# Patient Record
Sex: Female | Born: 1980 | Race: White | Hispanic: No | Marital: Married | Smoking: Current every day smoker
Health system: Southern US, Community
[De-identification: ages and names within clinical notes are randomized; demographics above are authoritative.]

## PROBLEM LIST (undated history)

## (undated) DIAGNOSIS — I214 Non-ST elevation (NSTEMI) myocardial infarction: Secondary | ICD-10-CM

## (undated) DIAGNOSIS — F1911 Other psychoactive substance abuse, in remission: Secondary | ICD-10-CM

## (undated) DIAGNOSIS — E669 Obesity, unspecified: Secondary | ICD-10-CM

## (undated) DIAGNOSIS — Z720 Tobacco use: Secondary | ICD-10-CM

## (undated) DIAGNOSIS — Z951 Presence of aortocoronary bypass graft: Secondary | ICD-10-CM

## (undated) DIAGNOSIS — F329 Major depressive disorder, single episode, unspecified: Secondary | ICD-10-CM

## (undated) DIAGNOSIS — I25110 Atherosclerotic heart disease of native coronary artery with unstable angina pectoris: Secondary | ICD-10-CM

## (undated) DIAGNOSIS — F32A Depression, unspecified: Secondary | ICD-10-CM

## (undated) DIAGNOSIS — F419 Anxiety disorder, unspecified: Secondary | ICD-10-CM

## (undated) HISTORY — PX: INGUINAL HERNIA REPAIR: SUR1180

---
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
## 2000-01-20 HISTORY — PX: ADENOIDECTOMY: SUR15

## 2008-01-20 HISTORY — PX: TUBAL LIGATION: SHX77

## 2010-06-16 ENCOUNTER — Emergency Department (HOSPITAL_COMMUNITY)
Admission: EM | Admit: 2010-06-16 | Discharge: 2010-06-16 | Disposition: A | Discharge status: Home / Self Care | Payer: Self-pay | Attending: Emergency Medicine | Admitting: Emergency Medicine

## 2010-06-16 DIAGNOSIS — R109 Unspecified abdominal pain: Secondary | ICD-10-CM | POA: Insufficient documentation

## 2010-06-16 DIAGNOSIS — R319 Hematuria, unspecified: Secondary | ICD-10-CM | POA: Insufficient documentation

## 2010-06-16 DIAGNOSIS — K089 Disorder of teeth and supporting structures, unspecified: Secondary | ICD-10-CM | POA: Insufficient documentation

## 2010-06-16 DIAGNOSIS — R300 Dysuria: Secondary | ICD-10-CM | POA: Insufficient documentation

## 2010-06-16 DIAGNOSIS — N390 Urinary tract infection, site not specified: Secondary | ICD-10-CM | POA: Insufficient documentation

## 2010-06-16 LAB — URINE MICROSCOPIC-ADD ON

## 2010-06-16 LAB — URINALYSIS, ROUTINE W REFLEX MICROSCOPIC
Glucose, UA: NEGATIVE mg/dL
Nitrite: NEGATIVE
Specific Gravity, Urine: 1.015 (ref 1.005–1.030)
pH: 7 (ref 5.0–8.0)

## 2010-06-16 LAB — POCT PREGNANCY, URINE: Preg Test, Ur: NEGATIVE

## 2012-09-26 ENCOUNTER — Encounter (HOSPITAL_COMMUNITY): Payer: Self-pay | Admitting: *Deleted

## 2012-09-26 ENCOUNTER — Emergency Department (HOSPITAL_COMMUNITY)
Admission: EM | Admit: 2012-09-26 | Discharge: 2012-09-26 | Disposition: A | Discharge status: Home / Self Care | Payer: Medicaid Other | Attending: Emergency Medicine | Admitting: Emergency Medicine

## 2012-09-26 DIAGNOSIS — Y93G3 Activity, cooking and baking: Secondary | ICD-10-CM | POA: Insufficient documentation

## 2012-09-26 DIAGNOSIS — Y929 Unspecified place or not applicable: Secondary | ICD-10-CM | POA: Insufficient documentation

## 2012-09-26 DIAGNOSIS — W268XXA Contact with other sharp object(s), not elsewhere classified, initial encounter: Secondary | ICD-10-CM | POA: Insufficient documentation

## 2012-09-26 DIAGNOSIS — S61219A Laceration without foreign body of unspecified finger without damage to nail, initial encounter: Secondary | ICD-10-CM

## 2012-09-26 DIAGNOSIS — S61209A Unspecified open wound of unspecified finger without damage to nail, initial encounter: Secondary | ICD-10-CM | POA: Insufficient documentation

## 2012-09-26 DIAGNOSIS — F17200 Nicotine dependence, unspecified, uncomplicated: Secondary | ICD-10-CM | POA: Insufficient documentation

## 2012-09-26 MED ORDER — LIDOCAINE HCL (PF) 2 % IJ SOLN
INTRAMUSCULAR | Status: AC
  Administered 2012-09-26: 10 mL
  Filled 2012-09-26: qty 10

## 2012-09-26 MED ORDER — CEPHALEXIN 500 MG PO CAPS: 500.0000 mg | ORAL_CAPSULE | Freq: Four times a day (QID) | ORAL | Status: DC

## 2012-09-26 MED ORDER — TRAMADOL HCL 50 MG PO TABS: 50.0000 mg | ORAL_TABLET | Freq: Four times a day (QID) | ORAL | Status: DC | PRN

## 2012-09-26 NOTE — ED Provider Notes (Signed)
CSN: [id number]     Arrival date & time 09/26/12  1531 History   First MD Initiated Contact with Patient 09/26/12 1538     Chief Complaint  Patient presents with  . Extremity Laceration   (Consider location/radiation/quality/duration/timing/severity/associated sxs/prior Treatment) HPI Comments: Catherine Goodman is a 32 y.o. Female presenting with a laceration to her right long finger,  Which occurred 12 hours ago.  She reports cutting it on a broken ceramic bowl while preparing breakfast around 4 am today.  She cleaned the wound and applied a bandaid,  But it continues to cause pain.  She denies numbness or weakness distal to the injury site.  She is utd with her tetanus.     The history is provided by the patient.    History reviewed. No pertinent past medical history. Past Surgical History  Procedure Laterality Date  . Hernia repair    . Cesarean section    . Tubal ligation     No family history on file. History  Substance Use Topics  . Smoking status: Current Every Day Smoker    Types: Cigarettes  . Smokeless tobacco: Not on file  . Alcohol Use: Yes     Comment: occasional   OB History   Grav Para Term Preterm Abortions TAB SAB Ect Mult Living                 Review of Systems  Constitutional: Negative for fever and chills.  HENT: Negative for facial swelling.   Respiratory: Negative for shortness of breath and wheezing.   Skin: Positive for wound.  Neurological: Negative for numbness.    Allergies  Review of patient's allergies indicates no known allergies.  Home Medications   Current Outpatient Rx  Name  Route  Sig  Dispense  Refill  . traMADol (ULTRAM) 50 MG tablet   Oral   Take 1 tablet (50 mg total) by mouth every 6 (six) hours as needed for pain.   15 tablet   0    BP 137/99  Pulse 95  Temp(Src) 98.4 F (36.9 C) (Oral)  Resp 20  Ht 5\' 6"  (1.676 m)  Wt 140 lb (63.504 kg)  BMI 22.61 kg/m2  SpO2 100%  LMP 09/19/2012 Physical Exam   Constitutional: She is oriented to person, place, and time. She appears well-developed and well-nourished.  HENT:  Head: Normocephalic.  Cardiovascular: Normal rate.   Pulmonary/Chest: Effort normal.  Neurological: She is alert and oriented to person, place, and time. No sensory deficit.  Skin: Laceration noted.  2 cm subcutaneous laceration volar finger, middle phalanx,  Clean well visualize base without retained fb.  Hemostatic,  Distal sensation intact.  Less than 3 sec cap refill.    ED Course  Procedures (including critical care time)  LACERATION REPAIR Performed by: Burgess Amor Authorized by: Burgess Amor Consent: Verbal consent obtained. Risks and benefits: risks, benefits and alternatives were discussed Consent given by: patient Patient identity confirmed: provided demographic data Prepped and Draped in normal sterile fashion Wound explored  Laceration Location: right index finger  Laceration Length: 2cm  No Foreign Bodies seen or palpated  Anesthesia:digital block  Local anesthetic: lidocaine 1% without epinephrine  Anesthetic total: 2 ml  Irrigation method: syringe Amount of cleaning: standard  Skin closure: ethilon 4-0  Number of sutures: 4  Technique: simple interrupted.  Patient tolerance: Patient tolerated the procedure well with no immediate complications.  Labs Review Labs Reviewed - No data to display Imaging Review No results found.  MDM   1. Laceration of finger of right hand, initial encounter    Wound care instructions given.  Pt advised to have sutures removed in 10 days,  Return here sooner for any signs of infection including redness, swelling, worse pain or drainage of pus.  Pt was given tramadol for pain relief.  Also prescribed keflex as this was an old laceration,  68 hours old.  Wound edges were slightly macerated (from being in a wet bandaid).  Explained to pt that the top layer may peel,  But will protect while the deeper layer is  healing.  Pt understands.       Burgess Amor, PA-C 09/26/12 1656

## 2012-09-26 NOTE — ED Provider Notes (Signed)
Medical screening examination/treatment/procedure(s) were performed by non-physician practitioner and as supervising physician I was immediately available for consultation/collaboration.   Edynn Gillock, MD 09/26/12 1842 

## 2012-09-26 NOTE — ED Notes (Signed)
Person identifying  himself as pts  Husband called and asked that we not give any  Pain pills to pt . Says she would "sniff them".

## 2012-09-26 NOTE — ED Notes (Signed)
Lac to RMF 5am ,  On broken ceramic bowl. Lac is to palmar surface of  Middle phalanx

## 2012-09-26 NOTE — ED Notes (Signed)
Laceration to right middle finger from a broken ceramic bowl.  Bleeding controlled.

## 2013-10-05 ENCOUNTER — Encounter (HOSPITAL_COMMUNITY): Payer: Self-pay | Admitting: Emergency Medicine

## 2013-10-05 ENCOUNTER — Emergency Department (HOSPITAL_COMMUNITY)
Admission: EM | Admit: 2013-10-05 | Discharge: 2013-10-05 | Discharge status: Against Medical Advice | Payer: Medicaid Other | Attending: Emergency Medicine | Admitting: Emergency Medicine

## 2013-10-05 DIAGNOSIS — F17200 Nicotine dependence, unspecified, uncomplicated: Secondary | ICD-10-CM | POA: Insufficient documentation

## 2013-10-05 DIAGNOSIS — R05 Cough: Secondary | ICD-10-CM | POA: Insufficient documentation

## 2013-10-05 DIAGNOSIS — R093 Abnormal sputum: Secondary | ICD-10-CM | POA: Insufficient documentation

## 2013-10-05 DIAGNOSIS — R51 Headache: Secondary | ICD-10-CM | POA: Insufficient documentation

## 2013-10-05 DIAGNOSIS — R52 Pain, unspecified: Secondary | ICD-10-CM | POA: Diagnosis not present

## 2013-10-05 DIAGNOSIS — R059 Cough, unspecified: Secondary | ICD-10-CM | POA: Insufficient documentation

## 2013-10-05 DIAGNOSIS — J3489 Other specified disorders of nose and nasal sinuses: Secondary | ICD-10-CM | POA: Insufficient documentation

## 2013-10-05 DIAGNOSIS — M549 Dorsalgia, unspecified: Secondary | ICD-10-CM | POA: Insufficient documentation

## 2013-10-05 NOTE — ED Notes (Signed)
Patient and visitor to nursing station. Visitor upset because they have been waiting and in his opinion they have been "waiting too long", "havent been seen fast enough" visitor stated they were "going somewhere else" where they "can been seen faster". Explained to patient and visitor that we would seen them as soon as possible, that they were next to be seen. Explained that the pt would be leaving against medical advice and what that meant. Catherine Goodman at nursing station when pt left AMA. Pt refused to sign E-signature.

## 2013-10-05 NOTE — ED Notes (Signed)
Cough,body aches,  Back pain,  Yellow sputum, nasal congestion, headache,.  Husband dx with pneumonia yesterday

## 2014-01-20 ENCOUNTER — Encounter (HOSPITAL_COMMUNITY): Payer: Self-pay

## 2014-01-20 ENCOUNTER — Emergency Department (HOSPITAL_COMMUNITY)
Admission: EM | Admit: 2014-01-20 | Discharge: 2014-01-20 | Disposition: A | Discharge status: Home / Self Care | Payer: Medicaid Other | Attending: Emergency Medicine | Admitting: Emergency Medicine

## 2014-01-20 DIAGNOSIS — K029 Dental caries, unspecified: Secondary | ICD-10-CM | POA: Diagnosis not present

## 2014-01-20 DIAGNOSIS — K088 Other specified disorders of teeth and supporting structures: Secondary | ICD-10-CM | POA: Insufficient documentation

## 2014-01-20 DIAGNOSIS — Z720 Tobacco use: Secondary | ICD-10-CM | POA: Insufficient documentation

## 2014-01-20 DIAGNOSIS — Z79899 Other long term (current) drug therapy: Secondary | ICD-10-CM | POA: Insufficient documentation

## 2014-01-20 DIAGNOSIS — K0889 Other specified disorders of teeth and supporting structures: Secondary | ICD-10-CM

## 2014-01-20 MED ORDER — AMOXICILLIN 500 MG PO CAPS: 500.0000 mg | ORAL_CAPSULE | Freq: Three times a day (TID) | ORAL | Status: DC

## 2014-01-20 MED ORDER — HYDROCODONE-ACETAMINOPHEN 5-325 MG PO TABS: ORAL_TABLET | ORAL | Status: DC

## 2014-01-20 NOTE — ED Notes (Signed)
Complain of dental pain and swelling to left side of face

## 2014-01-20 NOTE — ED Provider Notes (Signed)
CSN: [id number]     Arrival date & time 01/20/14  1547 History   First MD Initiated Contact with Patient 01/20/14 1611     Chief Complaint  Patient presents with  . Dental Pain     (Consider location/radiation/quality/duration/timing/severity/associated sxs/prior Treatment) HPI  Catherine Goodman is a 34 y.o. female who presents to the Emergency Department complaining of dental pain for 3 days.  She reports sharp, throbbing pain to her left upper teeth.  Pain radiates into her ear and cheek.  She also reports having swelling of her gums and intermittent chills.  She has been taking ibuprofen for pain without relief.  She has not contacted a dentist, states that her Medicaid will have to be reinstated.  She denies difficulty swallowing, breathing, fever, or neck pain    History reviewed. No pertinent past medical history. Past Surgical History  Procedure Laterality Date  . Hernia repair    . Cesarean section    . Tubal ligation     No family history on file. History  Substance Use Topics  . Smoking status: Current Every Day Smoker    Types: Cigarettes  . Smokeless tobacco: Not on file  . Alcohol Use: Yes     Comment: occasional   OB History    No data available     Review of Systems  Constitutional: Negative for fever and appetite change.  HENT: Positive for dental problem and facial swelling. Negative for congestion, sore throat and trouble swallowing.   Eyes: Negative for pain and visual disturbance.  Musculoskeletal: Negative for neck pain and neck stiffness.  Skin: Negative for rash.  Neurological: Negative for dizziness, facial asymmetry and headaches.  Hematological: Negative for adenopathy.  All other systems reviewed and are negative.     Allergies  Review of patient's allergies indicates no known allergies.  Home Medications   Prior to Admission medications   Medication Sig Start Date End Date Taking? Authorizing Provider  acetaminophen (TYLENOL) 500 MG  tablet Take 1,000 mg by mouth every 6 (six) hours as needed for headache.    Historical Provider, MD  ibuprofen (ADVIL,MOTRIN) 200 MG tablet Take 400 mg by mouth every 6 (six) hours as needed for headache.    Historical Provider, MD  Phenyleph-Doxylamine-DM-APAP (ALKA-SELTZER PLS NIGHT CLD/FLU) 5-6.25-10-325 MG CAPS Take 2 capsules by mouth daily as needed (cold).    Historical Provider, MD   BP 136/87 mmHg  Pulse 85  Temp(Src) 98 F (36.7 C) (Oral)  Resp 18  Ht  (1.676 m)  Wt 160 lb (72.576 kg)  BMI 25.84 kg/m2  SpO2 100%  LMP 01/13/2014 Physical Exam  Constitutional: She is oriented to person, place, and time. She appears well-developed and well-nourished. No distress.  HENT:  Head: Normocephalic and atraumatic.  Right Ear: Tympanic membrane and ear canal normal.  Left Ear: Tympanic membrane and ear canal normal.  Mouth/Throat: Uvula is midline, oropharynx is clear and moist and mucous membranes are normal. No trismus in the jaw. Dental caries present. No dental abscesses or uvula swelling.  Tenderness to palpation of the left upper molars, no obvious dental decay. Mild erythema and edema of the surrounding gums.  No facial swelling, obvious dental abscess, trismus, or sublingual abnml.    Neck: Normal range of motion. Neck supple.  Cardiovascular: Normal rate, regular rhythm and normal heart sounds.   No murmur heard. Pulmonary/Chest: Effort normal and breath sounds normal. No respiratory distress.  Musculoskeletal: Normal range of motion.  Lymphadenopathy:  She has no cervical adenopathy.  Neurological: She is alert and oriented to person, place, and time. She exhibits normal muscle tone. Coordination normal.  Skin: Skin is warm and dry.  Nursing note and vitals reviewed.   ED Course  Procedures (including critical care time) Labs Review Labs Reviewed - No data to display  Imaging Review No results found.   EKG Interpretation None      MDM   Final  diagnoses:  Pain, dental    Pt is non-toxic appearing,  Tearful on exam.  Reviewed on the [place] narcotic database.  No recent Rx's on file.    No concerning sx's for infection to the deep structures of the neck or floor of the mouth.  Pt given Rx for amoxil and hydrocodone.  Referral info given for local dentistry.  She appears stable for d/c   Zayra Devito L. Trisha Mangle, PA-C 01/21/14 2333  Flint Melter, MD 01/24/14 [phone]

## 2014-01-20 NOTE — Discharge Instructions (Signed)
Dental Pain °A tooth ache may be caused by cavities (tooth decay). Cavities expose the nerve of the tooth to air and hot or cold temperatures. It may come from an infection or abscess (also called a boil or furuncle) around your tooth. It is also often caused by dental caries (tooth decay). This causes the pain you are having. °DIAGNOSIS  °Your caregiver can diagnose this problem by exam. °TREATMENT  °· If caused by an infection, it may be treated with medications which kill germs (antibiotics) and pain medications as prescribed by your caregiver. Take medications as directed. °· Only take over-the-counter or prescription medicines for pain, discomfort, or fever as directed by your caregiver. °· Whether the tooth ache today is caused by infection or dental disease, you should see your dentist as soon as possible for further care. °SEEK MEDICAL CARE IF: °The exam and treatment you received today has been provided on an emergency basis only. This is not a substitute for complete medical or dental care. If your problem worsens or new problems (symptoms) appear, and you are unable to meet with your dentist, call or return to this location. °SEEK IMMEDIATE MEDICAL CARE IF:  °· You have a fever. °· You develop redness and swelling of your face, jaw, or neck. °· You are unable to open your mouth. °· You have severe pain uncontrolled by pain medicine. °MAKE SURE YOU:  °· Understand these instructions. °· Will watch your condition. °· Will get help right away if you are not doing well or get worse. °Document Released: 01/05/2005 Document Revised: 03/30/2011 Document Reviewed: 08/24/2007 °ExitCare® Patient Information ©2015 [employer]. This information is not intended to replace advice given to you by your health care provider. Make sure you discuss any questions you have with your health care provider. ° °Emergency Department Resource Guide °1) Find a Doctor and Pay Out of Pocket °Although you won't have to find out who  is covered by your insurance plan, it is a good idea to ask around and get recommendations. You will then need to call the office and see if the doctor you have chosen will accept you as a new patient and what types of options they offer for patients who are self-pay. Some doctors offer discounts or will set up payment plans for their patients who do not have insurance, but you will need to ask so you aren't surprised when you get to your appointment. ° °2) Contact Your Local Health Department °Not all health departments have doctors that can see patients for sick visits, but many do, so it is worth a call to see if yours does. If you don't know where your local health department is, you can check in your phone book. The CDC also has a tool to help you locate your state's health department, and many state websites also have listings of all of their local health departments. ° °3) Find a Walk-in Clinic °If your illness is not likely to be very severe or complicated, you may want to try a walk in clinic. These are popping up all over the country in pharmacies, drugstores, and shopping centers. They're usually staffed by nurse practitioners or physician assistants that have been trained to treat common illnesses and complaints. They're usually fairly quick and inexpensive. However, if you have serious medical issues or chronic medical problems, these are probably not your best option. ° °No Primary Care Doctor: °- Call Health Connect at  832-8000 - they can help you locate a primary   care doctor that  accepts your insurance, provides certain services, etc. °- Physician Referral Service- 1-800-533-3463 ° °Chronic Pain Problems: °Organization         Address  Phone   Notes  °[facility]  (336) 297-2271 Patients need to be referred by their primary care doctor.  ° °Medication Assistance: °Organization         Address  Phone   Notes  °[place] [facility] [address]  [address] °[place], [place] [address] (336) 641-8030 --Must be a resident of [place] °-- Must have NO insurance coverage whatsoever (no Medicaid/ Medicare, etc.) °-- The pt. MUST have a primary care doctor that directs their care regularly and follows them in the community °  °[facility]  (866) 331-1348   °[employer]  (888) 892-1162   ° °Agencies that provide inexpensive medical care: °Organization         Address  Phone   Notes  °[facility]  (336) 832-8035   °[facility]    (336) 832-7272   °[facility] [address] °[place], [place] [address] (336) 832-4777   °[facility] [address], °[place] (336) 271-4999   °[facility]    (336) 373-0678   °[facility]    (336) 272-1050   °[facility] ° [address], [place] Phone:  (336) 832-4444, Fax:  (336) 832-4440 Hours of Operation:  9 am - 6 pm, M-F.  Also accepts Medicaid/Medicare and self-pay.  °[facility] ° [address], [place] Phone: (336) 832-3150, Fax: (336) 832-3151. Hours of Operation:  8:30 am - 5:30 pm, M-F.  Also accepts Medicaid and self-pay.  °[facility] [place] [address], [place] Phone: (336) 878-6027   °[facility] [address], [place], [place] (336)723-1848, Ext. 123 Mondays & Thursdays: 7-9 AM.  First 15 patients are seen on a first come, first serve basis. °  ° °Medicaid-accepting [place] Providers: ° °Organization         Address  Phone   Notes  °[facility] [address], [place] (336) 641-2100 Also accepts self-pay patients.  °[facility] [address], [place] ° (336) 856-9996   °[facility] [address], [place] (336) 288-8857   °[facility] [address] [place] [address], [place] (336) 299-7000   °Veita Bland [address], [place]  ° (336) 373-1557 Only accepts [place] Access Medicaid patients after they have their name applied to their card.  ° °Self-Pay (no insurance) in [place]: ° °Organization         Address  Phone   Notes  °Sickle Cell Patients, [facility] [address], [place] (336) 832-1970   °[facility] [address], [place] (336) 832-4400   °[facility] [place] [facility] ° [address], [place] (336) 992-4800   °[facility]/Dr. Osei-Bonsu ° [address] [place] [address], [place] or [address], [place] (336) 841-8500 Phone number for both [place] and [place] locations is the same.  °[facility] [address], [place] (336) 299-0000   °[facility] [address] [place] [address], [place] or [address] (336) 852-7530 °(336) 878-2260   °  [facility] [address], [place] (336) 350-1642, phone; (336) 294-5005, fax Sees patients 1st and 3rd Saturday of every month.  Must not qualify for public or private insurance (i.e. Medicaid, Medicare, [place] Health Choice, Veterans' Benefits) • Household income should be no more than 200% of the poverty level •The clinic cannot treat you if you are pregnant or think you are pregnant • Sexually transmitted diseases are not treated at the clinic.  ° ° °Dental Care: °Organization         Address  Phone  Notes  °[facility] [facility] [address], [place] (336) 641-6152 Accepts children up to age 21 who are enrolled in Medicaid or [place] Health Choice; pregnant women with a Medicaid card; and children who have applied for Medicaid or [place] Health Choice, but were declined, whose parents can pay a reduced fee at time of service.  °[facility] [place]  [address], [place] (336) 641-7733 Accepts children up to age 21 who are  enrolled in Medicaid or [place] Health Choice; pregnant women with a Medicaid card; and children who have applied for Medicaid or [place] Health Choice, but were declined, whose parents can pay a reduced fee at time of service.  °[facility] ° [address], [place] (336) 641-4533 Patients are seen by appointment only. Walk-ins are not accepted. [facility] will see patients 18 years of age and older. °Monday - Tuesday (8am-5pm) °Most Wednesdays (8:30-5pm) °$30 per visit, cash only  °[facility] ° [address], [place] (336) 641-4533 Patients are seen by appointment only. Walk-ins are not accepted. [facility] will see patients 18 years of age and older. °One Wednesday Evening (Monthly: Volunteer Based).  $30 per visit, cash only  °[facility]  (919) 537-3737 for adults; Children under age 4, call Graduate Pediatric Dentistry at ([phone]) [phone]. Children aged 4-14, please call (919) [phone] to request a pediatric application. ° Dental services are provided in all areas of dental care including fillings, crowns and bridges, complete and partial dentures, implants, gum treatment, root canals, and extractions. Preventive care is also provided. Treatment is provided to both adults and children. °Patients are selected via a lottery and there is often a waiting list. °  °[facility] [address], °[place] ° (336) 763-8833 [email or web address] °  °[facility] [address], [place], [place] (336)723-1848, Ext. 123 Second and Fourth Thursday of each month, opens at 6:30 AM; Clinic ends at 9 AM.  Patients are seen on a first-come first-served basis, and a limited number are seen during each clinic.  ° °[facility] ° [address], [place], [place] (336) 723-7904   Eligibility Requirements °You must have lived in [place], or [place] counties for at least the last three months. °  You  cannot be eligible for state or federal sponsored healthcare insurance, including Veterans Administration, Medicaid, or Medicare. °  You generally cannot be eligible for healthcare insurance through your employer.  °  How to apply: °Eligibility screenings are held every Tuesday and Wednesday afternoon from 1:00 pm until 4:00 pm. You do not need an appointment for the interview!  °[facility] [address], [place], [place] 336-631-2330   °[facility] [facility]  336-342-8273   °[facility]  336-703-3100   °[facility]  336-570-6415   ° °Behavioral Health Resources in the Community: °Intensive Outpatient Programs °Organization         Address  Phone  Notes  °[facility] [address], [place], [place] 336-878-6098   °[facility] [address], [place], [place] 336-832-9800   °[facility] [address], [place], [place] ° 336-882-2125   °[facility] [address],  °[place], [place] 1-800-853-5163 or 336-641-4981   °Substance Abuse Resources °Organization         Address  Phone  Notes  °[facility]  336-882-2125   °[facility]  336-784-9470   °[facility]  336-285-9073   °[facility]  336-845-3988   °Residential & Outpatient Substance Abuse Program  1-800-659-3381   °Psychological Services °Organization         Address  Phone  Notes  °[facility]  336- 832-9600   °[facility]  336- 378-7881   °[facility] [address], [place] 1-800-853-5163 or 336-641-4981   ° °Mobile Crisis Teams °Organization         Address  Phone  Notes  °[facility], Mobile Crisis Care Unit  1-877-626-1772   °Assertive °[facility] ° [address] [place], [place] 336-834-9664   °Sharon DeEsch [address] °[place] 336-554-5454   ° °Self-Help/Support  Groups °Organization         Address  Phone             Notes  °[facility] - variety of support groups  336- 373-1402 Call for more information  °Narcotics Anonymous (NA), [facility] [address], °[place]  2 meetings at this location  ° °Residential Treatment Programs °Organization         Address  Phone  Notes  °[facility] [address],    °[place]  1-866-801-8205   °[facility] ° [address], [place], [place] 704-293-8524   °[facility] [address], [place] 336-845-3988 Admissions: 8am-3pm M-F  °[facility] [address]    °[place], [place] 336-841-1104   °[facility] [address], [place], [place] 336-379-7146   °[facility] [address]  °[place], [place] 336-285-9073   °[facility] - Intensive Outpatient [address], [place], [place] 336-852-3033   °[facility] [address]  °[place], [place] 1-877-615-2722 or 336-784-9470   °[facility] ([facility] [address] [place], [place] 336-227-7417 Accepts Medicaid  °[facility] [address]  °[place] 1-800-659-3381 Substance Abuse/Addiction Treatment  ° °[place] Behavioral Health Resources °Organization         Address  Phone  Notes  °[facility]  (888) 581-9988   °Julie Brannon, PhD [address] [place], [place]   (336) 349-5553 or (336) 951-0000   °[facility]   [address] °[place], [place] (336) 349-4454   °[facility] [address], [place], [place] (336) 342-8316 Insurance/Medicaid/sponsorship through [facility]  °[facility] [address]                                    [place], [place] (336) 342-8316 Therapy/tele-psych/case  °[facility]   [address]  ° [place], [place] (336) 349-2233    °Dr. Arfeen  (336) 349-4544   °[facility] [place]  [employer] [facility] [place] [facility] 1) [address], [place] °2) [address], [place] °3)  [address], [place] (336) 349-3220 °(336) 342-7768 ° °(336) 342-8140   °[place] Child Abuse Hotline (336) 342-1394 or (336) 342-3537 (After Hours)    ° ° ° °

## 2014-02-19 ENCOUNTER — Encounter (HOSPITAL_COMMUNITY): Payer: Self-pay

## 2014-02-19 ENCOUNTER — Emergency Department (HOSPITAL_COMMUNITY)
Admission: EM | Admit: 2014-02-19 | Discharge: 2014-02-19 | Disposition: A | Discharge status: Home / Self Care | Payer: Medicaid Other | Attending: Emergency Medicine | Admitting: Emergency Medicine

## 2014-02-19 DIAGNOSIS — K088 Other specified disorders of teeth and supporting structures: Secondary | ICD-10-CM | POA: Insufficient documentation

## 2014-02-19 DIAGNOSIS — K0889 Other specified disorders of teeth and supporting structures: Secondary | ICD-10-CM

## 2014-02-19 DIAGNOSIS — Z720 Tobacco use: Secondary | ICD-10-CM | POA: Diagnosis not present

## 2014-02-19 MED ORDER — IBUPROFEN 600 MG PO TABS: 600.0000 mg | ORAL_TABLET | Freq: Four times a day (QID) | ORAL | Status: DC | PRN

## 2014-02-19 MED ORDER — IBUPROFEN 800 MG PO TABS: 800.0000 mg | ORAL_TABLET | Freq: Once | ORAL | Status: DC

## 2014-02-19 NOTE — ED Provider Notes (Signed)
CSN: [id number]     Arrival date & time 02/19/14  1736 History  This chart was scribed for Catherine Goodman Catherine Petion, MD by Abel PrestoKara Demonbreun, ED Scribe. This patient was seen in room APA11/APA11 and the patient's care was started at 5:54 PM.    Chief Complaint  Patient presents with  . Dental Pain     Patient is a 34 y.o. female presenting with tooth pain. The history is provided by the patient. No language interpreter was used.  Dental Pain Location:  Upper Severity:  Severe Duration:  3 days Timing:  Constant Progression:  Unchanged Chronicity:  Recurrent Associated symptoms: no facial swelling and no fever   HPI Comments: Catherine Goodman is a 34 y.o. female who presents to the Emergency Department complaining of upper left sided dental pain with onset 3 days ago.  Pt notes associated chills. Pt was seen on 01/02 for same. Pt has finished course of amoxicillin. Pt states she was unable to follow-up with a dentist due to problems with her medicaid. Pt denies fever.  PMH - none  Past Surgical History  Procedure Laterality Date  . Hernia repair    . Cesarean section    . Tubal ligation     No family history on file. History  Substance Use Topics  . Smoking status: Current Every Day Smoker    Types: Cigarettes  . Smokeless tobacco: Not on file  . Alcohol Use: Yes     Comment: occasional   OB History    No data available     Review of Systems  Constitutional: Negative for fever.  HENT: Positive for dental problem. Negative for facial swelling.   Gastrointestinal: Negative for vomiting.      Allergies  Review of patient's allergies indicates no known allergies.  Home Medications   Prior to Admission medications   Medication Sig Start Date End Date Taking? Authorizing Provider  ibuprofen (ADVIL,MOTRIN) 600 MG tablet Take 1 tablet (600 mg total) by mouth every 6 (six) hours as needed. 02/19/14   Catherine Goodman [name], MD   BP 131/89 mmHg  Pulse 86  Temp(Src) 98.5 F (36.9 C)  (Oral)  Resp 18  Ht 5\' 6"  (1.676 m)  Wt 160 lb (72.576 kg)  BMI 25.84 kg/m2  SpO2 100%  LMP 02/13/2014 Physical Exam  Nursing note and vitals reviewed.  CONSTITUTIONAL: Well developed/well nourished HEAD AND FACE: Normocephalic/atraumatic EYES: EOMI/PERRL ENMT: Mucous membranes moist.  Poor dentition.  No trismus.  No focal abscess noted. NECK: supple no meningeal signs CV: S1/S2 noted, no murmurs/rubs/gallops noted LUNGS: Lungs are clear to auscultation bilaterally, no apparent distress ABDOMEN: soft, nontender, no rebound or guarding NEURO: Pt is awake/alert, moves all extremitiesx4 EXTREMITIES:full ROM SKIN: warm, color normal    ED Course  Procedures  DIAGNOSTIC STUDIES: Oxygen Saturation is 100% on room air, normal by my interpretation.    COORDINATION OF CARE: 5:56 PM Discussed treatment plan with patient at beside, the patient agrees with the plan and has no further questions at this time.    MDM   Final diagnoses:  Pain, dental    Nursing notes including past medical history and social history reviewed and considered in documentation   I personally performed the services described in this documentation, which was scribed in my presence. The recorded information has been reviewed and is accurate.      Catherine Goodman Solveig Fangman, MD 02/19/14 [phone]

## 2014-02-19 NOTE — ED Notes (Signed)
Pt not in room for discharge, believe pt. Walked out.

## 2014-02-19 NOTE — Discharge Instructions (Signed)

## 2014-02-19 NOTE — ED Notes (Signed)
Pt reports dental abscess that was treated with amoxicillin on Dec 30.  Reports abscess went away but came back 3 days ago.  Says followed up at the dental clinic but says was told her medicaid was "no good."

## 2015-07-16 DIAGNOSIS — Z139 Encounter for screening, unspecified: Secondary | ICD-10-CM

## 2015-09-17 NOTE — Congregational Nurse Program (Signed)
Congregational Nurse Program Note  Date of Encounter: 07/16/2015  Past Medical History: No past medical history on file.  Encounter Details: Client was assisted with appointment to establish a primary provider at the [facility]. Client has several health issues  that need to be addressed. Informed client of dental clinic scheduled for July at the [facility] to address dental issues. Pearletha AlfredJan Nehal Shives,RN CN  [phone].

## 2015-12-15 ENCOUNTER — Emergency Department (HOSPITAL_COMMUNITY)
Admission: EM | Admit: 2015-12-15 | Discharge: 2015-12-15 | Disposition: A | Discharge status: Home / Self Care | Payer: Medicaid Other | Attending: Emergency Medicine | Admitting: Emergency Medicine

## 2015-12-15 ENCOUNTER — Encounter (HOSPITAL_COMMUNITY): Payer: Self-pay | Admitting: *Deleted

## 2015-12-15 DIAGNOSIS — K047 Periapical abscess without sinus: Secondary | ICD-10-CM | POA: Insufficient documentation

## 2015-12-15 DIAGNOSIS — Z791 Long term (current) use of non-steroidal anti-inflammatories (NSAID): Secondary | ICD-10-CM | POA: Insufficient documentation

## 2015-12-15 DIAGNOSIS — F17210 Nicotine dependence, cigarettes, uncomplicated: Secondary | ICD-10-CM | POA: Insufficient documentation

## 2015-12-15 MED ORDER — AMOXICILLIN 500 MG PO CAPS: 500.0000 mg | ORAL_CAPSULE | Freq: Three times a day (TID) | ORAL | 0 refills | Status: AC

## 2015-12-15 MED ORDER — TRAMADOL HCL 50 MG PO TABS
50.0000 mg | ORAL_TABLET | Freq: Once | ORAL | Status: AC
  Administered 2015-12-15: 50 mg via ORAL
  Filled 2015-12-15: qty 1

## 2015-12-15 MED ORDER — TRAMADOL HCL 50 MG PO TABS: 50.0000 mg | ORAL_TABLET | Freq: Four times a day (QID) | ORAL | 0 refills | Status: DC | PRN

## 2015-12-15 NOTE — ED Triage Notes (Signed)
Pt right lower dental pain for 2 weeks, for last week c/o right jaw pain.  Taking ibuprofen for pain

## 2015-12-15 NOTE — Discharge Instructions (Signed)
Complete your entire course of antibiotics as prescribed.  You  may use the tramadol for pain relief but do not drive within 4 hours of taking as this will make you drowsy.  Avoid applying heat or ice to this abscess area which can worsen your symptoms.  You may use warm salt water swish and spit treatment or half peroxide and water swish and spit after meals to keep this area clean as discussed.  Call the dentist listed above for further management of your symptoms. ° ° °

## 2015-12-15 NOTE — ED Provider Notes (Signed)
[facility]-EMERGENCY DEPT Provider Note   CSN: [id number] Arrival date & time: 12/15/15  1455   By signing my name below, I, Valentino SaxonBianca Contreras, attest that this documentation has been prepared under the direction and in the presence of Burgess AmorJulie Sharni Negron, PA-C Electronically Signed: Valentino SaxonBianca Contreras, ED Scribe. 12/15/15. 4:52 PM.  History   Chief Complaint Chief Complaint  Patient presents with  . Dental Pain   The history is provided by the patient. No language interpreter was used.   HPI Comments: Catherine Goodman is a [age] y.o. female who presents to the Emergency Department complaining of gradually worsening right-sided dental pain onset a week ago. Pt notes that her pain has increased over the past three days to the point where she can't sleep. She notes having a bad tooth in the back that is broken with accompanied jaw pain. She notes trying salt water remedy, mouthwash, orajel and baking soda remedy. Pt reports taking  ibuprofen without relief. She also notes breaking out in sweats when her dental pain is brought on. Pt denies fever and facial swelling. Per pt, she notes taking prescribed Buspar. No additional complaints at this time.   No past medical history on file.  There are no active problems to display for this patient.   Past Surgical History:  Procedure Laterality Date  . CESAREAN SECTION    . HERNIA REPAIR    . TUBAL LIGATION      OB History    No data available       Home Medications    Prior to Admission medications   Medication Sig Start Date End Date Taking? Authorizing Provider  ibuprofen (ADVIL,MOTRIN) 600 MG tablet Take 1 tablet (600 mg total) by mouth every 6 (six) hours as needed. 02/19/14   Zadie Rhineonald Wickline, MD    Family History No family history on file.  Social History Social History  Substance Use Topics  . Smoking status: Current Every Day Smoker    Types: Cigarettes  . Smokeless tobacco: Never Used  . Alcohol use Yes     Comment: occasional      Allergies   Patient has no known allergies.   Review of Systems Review of Systems  Constitutional: Negative for chills and fever.  HENT: Positive for dental problem. Negative for facial swelling, trouble swallowing and voice change.   Eyes: Negative.   Respiratory: Negative.   Gastrointestinal: Negative.  Negative for nausea and vomiting.     Physical Exam Updated Vital Signs BP 131/76 (BP Location: Left Arm)   Pulse 93   Temp 97.5 F (36.4 C) (Oral)   Resp 20   Ht 5\' 7"  (1.702 m)   Wt 205 lb (93 kg)   LMP 11/24/2015   SpO2 100%   BMI 32.11 kg/m   Physical Exam  Constitutional: She is oriented to person, place, and time. She appears well-developed and well-nourished. No distress.  HENT:  Head: Normocephalic and atraumatic.  Right Ear: Tympanic membrane and external ear normal.  Left Ear: Tympanic membrane and external ear normal.  Mouth/Throat: Oropharynx is clear and moist and mucous membranes are normal. No oral lesions. No trismus in the jaw. No dental abscesses.    Eyes: Conjunctivae are normal. Right eye exhibits no discharge. Left eye exhibits no discharge.  Neck: Normal range of motion. Neck supple.  Cardiovascular: Normal rate and normal heart sounds.   Pulmonary/Chest: Effort normal. No respiratory distress.  Musculoskeletal: Normal range of motion.  Lymphadenopathy:    She has no cervical  adenopathy.  Neurological: She is alert and oriented to person, place, and time. Coordination normal.  Skin: Skin is warm and dry. No rash noted. She is not diaphoretic. No erythema.  Psychiatric: She has a normal mood and affect.  Nursing note and vitals reviewed.    ED Treatments / Results   DIAGNOSTIC STUDIES: Oxygen Saturation is 100% on RA, normal by my interpretation.    COORDINATION OF CARE: 4:45 PM Discussed treatment plan with pt at bedside which includes antibiotics and tramadol and referral to dentist for f/u and pt agreed to plan.   Labs (all  labs ordered are listed, but only abnormal results are displayed) Labs Reviewed - No data to display  EKG  EKG Interpretation None       Radiology No results found.  Procedures Procedures (including critical care time)  Medications Ordered in ED Medications - No data to display   Initial Impression / Assessment and Plan / ED Course  I have reviewed the triage vital signs and the nursing notes.  Pertinent labs & imaging results that were available during my care of the patient were reviewed by me and considered in my medical decision making (see chart for details).  Clinical Course     Dental decay with suspected early abscess.  Amoxil, tramadol, dental referrals given.  Final Clinical Impressions(s) / ED Diagnoses   Final diagnoses:  Dental infection    New Prescriptions New Prescriptions   AMOXICILLIN (AMOXIL) 500 MG CAPSULE    Take 1 capsule (500 mg total) by mouth 3 (three) times daily.   TRAMADOL (ULTRAM) 50 MG TABLET    Take 1 tablet (50 mg total) by mouth every 6 (six) hours as needed.    I personally performed the services described in this documentation, which was scribed in my presence. The recorded information has been reviewed and is accurate.    Burgess AmorJulie Verneal Wiers, PA-C 12/15/15 1703    Pricilla LovelessScott Goldston, MD 12/21/15 0100

## 2016-08-12 ENCOUNTER — Inpatient Hospital Stay (HOSPITAL_COMMUNITY)
Admission: AD | Admit: 2016-08-12 | Discharge: 2016-08-18 | DRG: 233 | Disposition: A | Discharge status: Home / Self Care | Payer: Medicaid Other | Source: Other Acute Inpatient Hospital | Attending: Thoracic Surgery (Cardiothoracic Vascular Surgery) | Admitting: Thoracic Surgery (Cardiothoracic Vascular Surgery)

## 2016-08-12 ENCOUNTER — Encounter (HOSPITAL_COMMUNITY): Payer: Self-pay | Admitting: General Practice

## 2016-08-12 DIAGNOSIS — Z720 Tobacco use: Secondary | ICD-10-CM | POA: Diagnosis present

## 2016-08-12 DIAGNOSIS — Z951 Presence of aortocoronary bypass graft: Secondary | ICD-10-CM

## 2016-08-12 DIAGNOSIS — Z8249 Family history of ischemic heart disease and other diseases of the circulatory system: Secondary | ICD-10-CM

## 2016-08-12 DIAGNOSIS — R570 Cardiogenic shock: Secondary | ICD-10-CM | POA: Diagnosis present

## 2016-08-12 DIAGNOSIS — F1911 Other psychoactive substance abuse, in remission: Secondary | ICD-10-CM

## 2016-08-12 DIAGNOSIS — R079 Chest pain, unspecified: Secondary | ICD-10-CM

## 2016-08-12 DIAGNOSIS — Z9889 Other specified postprocedural states: Secondary | ICD-10-CM

## 2016-08-12 DIAGNOSIS — E7800 Pure hypercholesterolemia, unspecified: Secondary | ICD-10-CM | POA: Diagnosis present

## 2016-08-12 DIAGNOSIS — D62 Acute posthemorrhagic anemia: Secondary | ICD-10-CM | POA: Diagnosis not present

## 2016-08-12 DIAGNOSIS — E669 Obesity, unspecified: Secondary | ICD-10-CM | POA: Diagnosis present

## 2016-08-12 DIAGNOSIS — K5900 Constipation, unspecified: Secondary | ICD-10-CM | POA: Diagnosis not present

## 2016-08-12 DIAGNOSIS — I214 Non-ST elevation (NSTEMI) myocardial infarction: Principal | ICD-10-CM | POA: Diagnosis present

## 2016-08-12 DIAGNOSIS — I2510 Atherosclerotic heart disease of native coronary artery without angina pectoris: Secondary | ICD-10-CM | POA: Diagnosis present

## 2016-08-12 DIAGNOSIS — G8929 Other chronic pain: Secondary | ICD-10-CM | POA: Diagnosis present

## 2016-08-12 DIAGNOSIS — F1010 Alcohol abuse, uncomplicated: Secondary | ICD-10-CM | POA: Diagnosis present

## 2016-08-12 DIAGNOSIS — I25110 Atherosclerotic heart disease of native coronary artery with unstable angina pectoris: Secondary | ICD-10-CM | POA: Diagnosis present

## 2016-08-12 DIAGNOSIS — F17210 Nicotine dependence, cigarettes, uncomplicated: Secondary | ICD-10-CM | POA: Diagnosis present

## 2016-08-12 DIAGNOSIS — Z6833 Body mass index (BMI) 33.0-33.9, adult: Secondary | ICD-10-CM

## 2016-08-12 DIAGNOSIS — F1410 Cocaine abuse, uncomplicated: Secondary | ICD-10-CM | POA: Diagnosis present

## 2016-08-12 DIAGNOSIS — M549 Dorsalgia, unspecified: Secondary | ICD-10-CM | POA: Diagnosis present

## 2016-08-12 DIAGNOSIS — F1510 Other stimulant abuse, uncomplicated: Secondary | ICD-10-CM | POA: Diagnosis present

## 2016-08-12 DIAGNOSIS — Z01818 Encounter for other preprocedural examination: Secondary | ICD-10-CM

## 2016-08-12 DIAGNOSIS — Z79899 Other long term (current) drug therapy: Secondary | ICD-10-CM

## 2016-08-12 DIAGNOSIS — F329 Major depressive disorder, single episode, unspecified: Secondary | ICD-10-CM | POA: Diagnosis present

## 2016-08-12 DIAGNOSIS — F419 Anxiety disorder, unspecified: Secondary | ICD-10-CM | POA: Diagnosis present

## 2016-08-12 DIAGNOSIS — J9811 Atelectasis: Secondary | ICD-10-CM | POA: Diagnosis not present

## 2016-08-12 HISTORY — DX: Major depressive disorder, single episode, unspecified: F32.9

## 2016-08-12 HISTORY — DX: Atherosclerotic heart disease of native coronary artery with unstable angina pectoris: I25.110

## 2016-08-12 HISTORY — DX: Other psychoactive substance abuse, in remission: F19.11

## 2016-08-12 HISTORY — DX: Depression, unspecified: F32.A

## 2016-08-12 HISTORY — DX: Obesity, unspecified: E66.9

## 2016-08-12 HISTORY — DX: Non-ST elevation (NSTEMI) myocardial infarction: I21.4

## 2016-08-12 HISTORY — DX: Tobacco use: Z72.0

## 2016-08-12 HISTORY — DX: Anxiety disorder, unspecified: F41.9

## 2016-08-12 HISTORY — DX: Presence of aortocoronary bypass graft: Z95.1

## 2016-08-12 LAB — LIPID PANEL
CHOLESTEROL: 235 mg/dL — AB (ref 0–200)
HDL: 75 mg/dL (ref >40)
LDL Cholesterol: 123 mg/dL — ABNORMAL HIGH (ref 0–99)
Total CHOL/HDL Ratio: 3.1 RATIO
Triglycerides: 187 mg/dL — ABNORMAL HIGH (ref <150)
VLDL: 37 mg/dL (ref 0–40)

## 2016-08-12 MED ORDER — NITROGLYCERIN 0.4 MG SL SUBL: 0.4000 mg | SUBLINGUAL_TABLET | SUBLINGUAL | Status: DC | PRN

## 2016-08-12 MED ORDER — ACETAMINOPHEN 325 MG PO TABS
650.0000 mg | ORAL_TABLET | ORAL | Status: DC | PRN
  Administered 2016-08-12 – 2016-08-13 (×2): 650 mg via ORAL
  Filled 2016-08-12 (×2): qty 2

## 2016-08-12 MED ORDER — ASPIRIN EC 81 MG PO TBEC
81.0000 mg | DELAYED_RELEASE_TABLET | Freq: Every day | ORAL | Status: DC
  Administered 2016-08-13: 07:00:00 81 mg via ORAL
  Filled 2016-08-12: qty 1

## 2016-08-12 MED ORDER — HEPARIN (PORCINE) IN NACL 100-0.45 UNIT/ML-% IJ SOLN: 1400.0000 [IU]/h | INTRAMUSCULAR | Status: DC

## 2016-08-12 MED ORDER — ATORVASTATIN CALCIUM 40 MG PO TABS
40.0000 mg | ORAL_TABLET | Freq: Every day | ORAL | Status: DC
  Administered 2016-08-12 – 2016-08-17 (×5): 40 mg via ORAL
  Filled 2016-08-12 (×5): qty 1

## 2016-08-12 MED ORDER — NITROGLYCERIN IN D5W 200-5 MCG/ML-% IV SOLN: 0.0000 ug/min | INTRAVENOUS | Status: DC

## 2016-08-12 MED ORDER — ONDANSETRON HCL 4 MG/2ML IJ SOLN: 4.0000 mg | Freq: Four times a day (QID) | INTRAMUSCULAR | Status: DC | PRN

## 2016-08-12 NOTE — Progress Notes (Signed)
Reports last ETOH use was 08/11/16 @ 2300.

## 2016-08-12 NOTE — Progress Notes (Signed)
Heparin drip started prior to arrival at [facility] at 1000 units/hour starting at 1825. Bolus of 4000 units given prior to arrival at 1820 per [facility]

## 2016-08-12 NOTE — Progress Notes (Signed)
ANTICOAGULATION CONSULT NOTE - Initial Consult  Pharmacy Consult for heparin Indication: chest pain/ACS  No Known Allergies  Patient Measurements: Height: 5\' 6"  (167.6 cm) Weight: 205 lb 0.4 oz (93 kg) IBW/kg (Calculated) : 59.3 Heparin Dosing Weight: 79.8 Kg  Vital Signs: Temp: 98.4 F (36.9 C) (07/25 2117) Temp Source: Oral (07/25 2117) BP: 148/93 (07/25 2200) Pulse Rate: 85 (07/25 2200)  Labs: No results for input(s): HGB, HCT, PLT, APTT, LABPROT, INR, HEPARINUNFRC, HEPRLOWMOCWT, CREATININE, CKTOTAL, CKMB, TROPONINI in the last 72 hours.  CrCl cannot be calculated (No order found.).   Medical History: Past Medical History:  Diagnosis Date  . Anxiety   . Depression   . NSTEMI (non-ST elevated myocardial infarction) (HCC) 08/12/2016  . Obesity (BMI 30-39.9)     Medications:  Prescriptions Prior to Admission  Medication Sig Dispense Refill Last Dose  . ibuprofen (ADVIL,MOTRIN) 600 MG tablet Take 1 tablet (600 mg total) by mouth every 6 (six) hours as needed. 30 tablet 0   . traMADol (ULTRAM) 50 MG tablet Take 1 tablet (50 mg total) by mouth every 6 (six) hours as needed. 20 tablet 0    Assessment: [age] yo[facility]. Heparin infusion was started there around 1820 and the patient was transferred to this facility. RN confirmed no infusion pauses or delays during transport.  Baseline labs pending. Will order confirmatory level.  Goal of Therapy:  Heparin level 0.3-0.7 units/ml Monitor platelets by anticoagulation protocol: Yes   Plan:  Continue heparin infusion at 1000 units/hr Check anti-Xa level in 6 hours and daily while on heparin Continue to monitor H&H and platelets  Catherine Goodman Catherine Goodman 08/12/2016,10:35 PM

## 2016-08-12 NOTE — H&P (Addendum)
History and Physical   Admit date: 08/12/2016 Name:  Catherine PaschalCharity V Goodman Medical record number: [id number] DOB/Age:  08/20/1980  36 y.o. female  Referring Physician:   Emergency room [facility]  Primary Cardiologist:  [facility]  Primary Physician:   None current  Chief complaint/reason for admission: Chest pain  HPI:  This 36 year old female has a history of substance abuse and some psychiatric issues.  She is followed at mental health and does not have a primary physician.  She has been obese.  She has smoked one pack cigarettes per day for the past 24 years.  She is a family history of cardiac disease in that her father had bypass she says at about the age that she had it.  She has been under severe situational stress.  She admits to using cocaine in the past maybe 2 months ago and yesterday took an Adderall to gain energy.  The Adderall she obtained from an unknown person.  She evidently was awake most the evening and today was lying down and had the onset of midsternal pain described as a rushing type pain as somebody was sitting on her chest.  She states it lasted 30-40 minutes.  She was seen at the emergency room in [place] and had an EKG there that showed some T-wave abnormalities in V2 and V3 with an abnormal troponin.  Arrangements were made for her to be transferred here.  She had recurrent pain in the emergency room and received heparin and was placed on nitroglycerin.  She is currently pain-free.  She was hospitalized for an overdose a few years ago with Neurontin and other medications.  She says that she drinks sometimes as much as a half of a bottle of alcohol daily and also drinks beer.   Past Medical History:  Diagnosis Date  . Anxiety   . Depression   . NSTEMI (non-ST elevated myocardial infarction) (HCC) 08/12/2016  . Obesity (BMI 30-39.9)      Past Surgical History:  Procedure Laterality Date  . ADENOIDECTOMY  2002  . CESAREAN SECTION  2010  . INGUINAL HERNIA REPAIR  Right ~ 2012   w/mesh  . TONSILLECTOMY  1992  . TUBAL LIGATION  2010  . Allergies: has No Known Allergies.   Medications: Prior to Admission medications   Medication Sig Start Date End Date Taking? Authorizing Provider  ibuprofen (ADVIL,MOTRIN) 600 MG tablet Take 1 tablet (600 mg total) by mouth every 6 (six) hours as needed. 02/19/14   Zadie RhineWickline, Donald, MD  traMADol (ULTRAM) 50 MG tablet Take 1 tablet (50 mg total) by mouth every 6 (six) hours as needed. 12/15/15   Burgess AmorIdol, Julie, PA-C    Family History:  Mother died of suicide earlier this year.  Her father evidently had bypass in his late 430s and has had several operations since then.  She has 2 brothers and 2 step brothers.  One brother evidently has cardiac disease.  Social History:   reports that she has been smoking Cigarettes.  She has a 24.00 pack-year smoking history. She has never used smokeless tobacco. She reports that she drinks about 36.0 oz of alcohol per week . She reports that she uses drugs, including Marijuana, Other-see comments, and Cocaine.   Social History   Social History Narrative   Married for 15 years, has 4 children     Review of Systems:  Obese for several years.  Wears contacts.  Has significant issues with situational stress and anxiety and depression under mental  health treatment.  Uses some drugs illicitly but no IV drug abuse in the past.  Has had chronic back pain for a number of years and has a history of opioid drug abuse. Other than as noted above, the remainder of the review of systems is normal  Physical Exam: BP 129/82 (BP Location: Right Arm)   Pulse 84   Temp 98.4 F (36.9 C) (Oral)   Resp 13   Ht 5\' 6"  (1.676 m)   Wt 93 kg (205 lb 0.4 oz)   SpO2 99%   BMI 33.09 kg/m  General appearance: Obese female currently in no acute distress Head: Normocephalic, without obvious abnormality, atraumatic Eyes: conjunctivae/corneas clear. PERRL, EOM's intact. Fundi not examined Neck: no adenopathy,  no carotid bruit, no JVD and supple, symmetrical, trachea midline Lungs: clear to auscultation bilaterally Heart: regular rate and rhythm, S1, S2 normal, no murmur, click, rub or gallop Abdomen: soft, non-tender; bowel sounds normal; no masses,  no organomegaly Pelvic: deferred Extremities: extremities normal, atraumatic, no cyanosis or edema Pulses: 2+ and symmetric Skin: Skin color, texture, turgor normal.  Tattoo present a leg Neurologic: Grossly normal  Labs: BUN 13 creatinine 0.58 glucose 95 sodium 138 potassium 4.2 chloride 102 CO2 18.2 magnesium 1.8 troponin 0.19 at the other hospital white count 13,300 and hemoglobin 13.8 hematocrit 41.1 normal indices normal differential platelet count 332,000 EKG: Reviewed from other hospital and shows T-wave inversions in V2 and flattening in V3 that could be a feminine pattern, other T waves were normal. Independently reviewed by me  Radiology: No acute disease   IMPRESSIONS: 1.  Prolonged chest pain with elevated troponin and possible non-STEMI 2.  Substance abuse with amphetamines and alcohol 3.  Obesity 4.  Anxiety and depression 5.  Family history of cardiovascular disease 6.  Tobacco abuse  PLAN: Continue intravenous heparin, check serial cardiac enzymes.  Observe for alcohol withdrawal.  Cardiac catheterization in the morning. Cardiac catheterization was discussed with the patient fully including risks of myocardial infarction, death, stroke, bleeding, arrhythmia, dye allergy, renal insufficiency or bleeding.  The patient understands and is willing to proceed.  Possibility of intervention at the same time also discussed with patient and husband and they understand and are agreeable to proceed   Signed: W. Ashley RoyaltySpencer Rikayla Demmon, Jr. MD [facility]  08/12/2016, 10:21 PM

## 2016-08-13 ENCOUNTER — Encounter (HOSPITAL_COMMUNITY)
Admission: AD | Disposition: A | Discharge status: Home / Self Care | Payer: Self-pay | Source: Other Acute Inpatient Hospital | Attending: Thoracic Surgery (Cardiothoracic Vascular Surgery)

## 2016-08-13 ENCOUNTER — Inpatient Hospital Stay (HOSPITAL_COMMUNITY): Payer: Self-pay

## 2016-08-13 ENCOUNTER — Ambulatory Visit (HOSPITAL_COMMUNITY): Payer: Self-pay

## 2016-08-13 ENCOUNTER — Encounter (HOSPITAL_COMMUNITY): Payer: Self-pay | Admitting: Internal Medicine

## 2016-08-13 ENCOUNTER — Inpatient Hospital Stay (HOSPITAL_COMMUNITY): Payer: Self-pay | Admitting: Anesthesiology

## 2016-08-13 DIAGNOSIS — Z951 Presence of aortocoronary bypass graft: Secondary | ICD-10-CM

## 2016-08-13 DIAGNOSIS — I214 Non-ST elevation (NSTEMI) myocardial infarction: Principal | ICD-10-CM

## 2016-08-13 DIAGNOSIS — I25110 Atherosclerotic heart disease of native coronary artery with unstable angina pectoris: Secondary | ICD-10-CM

## 2016-08-13 DIAGNOSIS — R570 Cardiogenic shock: Secondary | ICD-10-CM

## 2016-08-13 DIAGNOSIS — Z720 Tobacco use: Secondary | ICD-10-CM | POA: Diagnosis present

## 2016-08-13 HISTORY — PX: INTRAVASCULAR ULTRASOUND/IVUS: CATH118244

## 2016-08-13 HISTORY — PX: CORONARY ARTERY BYPASS GRAFT: SHX141

## 2016-08-13 HISTORY — PX: LEFT HEART CATH AND CORONARY ANGIOGRAPHY: CATH118249

## 2016-08-13 HISTORY — DX: Presence of aortocoronary bypass graft: Z95.1

## 2016-08-13 HISTORY — PX: IABP INSERTION: CATH118242

## 2016-08-13 HISTORY — PX: TEE WITHOUT CARDIOVERSION: SHX5443

## 2016-08-13 LAB — POCT I-STAT, CHEM 8
BUN: 15 mg/dL (ref 6–20)
BUN: 14 mg/dL (ref 6–20)
BUN: 14 mg/dL (ref 6–20)
CALCIUM ION: 1.25 mmol/L (ref 1.15–1.40)
CALCIUM ION: 1.2 mmol/L (ref 1.15–1.40)
CREATININE: 0.5 mg/dL (ref 0.44–1.00)
Calcium, Ion: 1.22 mmol/L (ref 1.15–1.40)
Chloride: 109 mmol/L (ref 101–111)
Chloride: 108 mmol/L (ref 101–111)
Chloride: 110 mmol/L (ref 101–111)
Creatinine, Ser: 0.5 mg/dL (ref 0.44–1.00)
Creatinine, Ser: 0.6 mg/dL (ref 0.44–1.00)
GLUCOSE: 119 mg/dL — AB (ref 65–99)
Glucose, Bld: 114 mg/dL — ABNORMAL HIGH (ref 65–99)
Glucose, Bld: 113 mg/dL — ABNORMAL HIGH (ref 65–99)
HCT: 35 % — ABNORMAL LOW (ref 36.0–46.0)
HCT: 33 % — ABNORMAL LOW (ref 36.0–46.0)
HEMATOCRIT: 33 % — AB (ref 36.0–46.0)
HEMOGLOBIN: 11.9 g/dL — AB (ref 12.0–15.0)
HEMOGLOBIN: 11.2 g/dL — AB (ref 12.0–15.0)
HEMOGLOBIN: 11.2 g/dL — AB (ref 12.0–15.0)
POTASSIUM: 4.2 mmol/L (ref 3.5–5.1)
Potassium: 4 mmol/L (ref 3.5–5.1)
Potassium: 4.1 mmol/L (ref 3.5–5.1)
SODIUM: 139 mmol/L (ref 135–145)
Sodium: 140 mmol/L (ref 135–145)
Sodium: 139 mmol/L (ref 135–145)
TCO2: 24 mmol/L (ref 0–100)
TCO2: 24 mmol/L (ref 0–100)
TCO2: 27 mmol/L (ref 0–100)

## 2016-08-13 LAB — CBC
HCT: 38.3 % (ref 36.0–46.0)
HCT: 34 % — ABNORMAL LOW (ref 36.0–46.0)
HEMATOCRIT: 37.3 % (ref 36.0–46.0)
HEMOGLOBIN: 12 g/dL (ref 12.0–15.0)
Hemoglobin: 12.8 g/dL (ref 12.0–15.0)
Hemoglobin: 11.1 g/dL — ABNORMAL LOW (ref 12.0–15.0)
MCH: 29.2 pg (ref 26.0–34.0)
MCH: 29.7 pg (ref 26.0–34.0)
MCH: 29.9 pg (ref 26.0–34.0)
MCHC: 32.2 g/dL (ref 30.0–36.0)
MCHC: 32.6 g/dL (ref 30.0–36.0)
MCHC: 33.4 g/dL (ref 30.0–36.0)
MCV: 89.5 fL (ref 78.0–100.0)
MCV: 90.8 fL (ref 78.0–100.0)
MCV: 90.9 fL (ref 78.0–100.0)
PLATELETS: 225 10*3/uL (ref 150–400)
PLATELETS: 327 10*3/uL (ref 150–400)
Platelets: 283 10*3/uL (ref 150–400)
RBC: 4.28 MIL/uL (ref 3.87–5.11)
RBC: 4.11 MIL/uL (ref 3.87–5.11)
RBC: 3.74 MIL/uL — ABNORMAL LOW (ref 3.87–5.11)
RDW: 15.9 % — AB (ref 11.5–15.5)
RDW: 15.3 % (ref 11.5–15.5)
RDW: 15.3 % (ref 11.5–15.5)
WBC: 10.5 10*3/uL (ref 4.0–10.5)
WBC: 7.7 10*3/uL (ref 4.0–10.5)
WBC: 9.3 10*3/uL (ref 4.0–10.5)

## 2016-08-13 LAB — APTT
aPTT: 60 seconds — ABNORMAL HIGH (ref 24–36)
aPTT: 27 seconds (ref 24–36)

## 2016-08-13 LAB — URINALYSIS, ROUTINE W REFLEX MICROSCOPIC
Bilirubin Urine: NEGATIVE
Glucose, UA: NEGATIVE mg/dL
HGB URINE DIPSTICK: NEGATIVE
Ketones, ur: NEGATIVE mg/dL
Leukocytes, UA: NEGATIVE
NITRITE: POSITIVE — AB
PROTEIN: NEGATIVE mg/dL
pH: 6 (ref 5.0–8.0)

## 2016-08-13 LAB — POCT I-STAT 3, ART BLOOD GAS (G3+)
ACID-BASE DEFICIT: 5 mmol/L — AB (ref 0.0–2.0)
Bicarbonate: 21.8 mmol/L (ref 20.0–28.0)
O2 SAT: 100 %
PCO2 ART: 45.3 mmHg (ref 32.0–48.0)
PO2 ART: 338 mmHg — AB (ref 83.0–108.0)
TCO2: 23 mmol/L (ref 0–100)
pH, Arterial: 7.29 — ABNORMAL LOW (ref 7.350–7.450)

## 2016-08-13 LAB — TYPE AND SCREEN
ABO/RH(D): A POS
Antibody Screen: NEGATIVE

## 2016-08-13 LAB — ECHOCARDIOGRAM COMPLETE
CHL CUP DOP CALC LVOT VTI: 22.9 cm
CHL CUP MV DEC (S): 162
E decel time: 162 msec
EERAT: 6.21
FS: 31 % (ref 28–44)
Height: 66 in
IVS/LV PW RATIO, ED: 0.86
LA diam index: 1.88 cm/m2
LA vol A4C: 39.9 ml
LASIZE: 38 mm
LAVOL: 43.7 mL
LAVOLIN: 21.6 mL/m2
LDCA: 3.46 cm2
LEFT ATRIUM END SYS DIAM: 38 mm
LV E/e' medial: 6.21
LV E/e'average: 6.21
LV PW d: 9.84 mm — AB (ref 0.6–1.1)
LV SIMPSON'S DISK: 51
LV dias vol: 103 mL (ref 46–106)
LV sys vol index: 25 mL/m2
LV sys vol: 50 mL — AB (ref 14–42)
LVDIAVOLIN: 51 mL/m2
LVELAT: 10.9 cm/s
LVOT diameter: 21 mm
LVOTPV: 102 cm/s
LVOTSV: 79 mL
Lateral S' vel: 13.7 cm/s
MV pk A vel: 84 m/s
MV pk E vel: 67.7 m/s
RV TAPSE: 25.4 mm
Stroke v: 53 ml
TDI e' lateral: 10.9
TDI e' medial: 7.83
WEIGHTICAEL: 3280.44 [oz_av]

## 2016-08-13 LAB — BASIC METABOLIC PANEL
Anion gap: 8 (ref 5–15)
BUN: 17 mg/dL (ref 6–20)
CALCIUM: 9.2 mg/dL (ref 8.9–10.3)
CHLORIDE: 107 mmol/L (ref 101–111)
CO2: 23 mmol/L (ref 22–32)
CREATININE: 0.8 mg/dL (ref 0.44–1.00)
GFR calc non Af Amer: >60 mL/min (ref >60)
Glucose, Bld: 115 mg/dL — ABNORMAL HIGH (ref 65–99)
Potassium: 3.2 mmol/L — ABNORMAL LOW (ref 3.5–5.1)
SODIUM: 138 mmol/L (ref 135–145)

## 2016-08-13 LAB — SURGICAL PCR SCREEN
MRSA, PCR: NEGATIVE
Staphylococcus aureus: NEGATIVE

## 2016-08-13 LAB — COMPREHENSIVE METABOLIC PANEL
ALT: 19 U/L (ref 14–54)
AST: 28 U/L (ref 15–41)
Albumin: 4 g/dL (ref 3.5–5.0)
Alkaline Phosphatase: 63 U/L (ref 38–126)
Anion gap: 8 (ref 5–15)
BILIRUBIN TOTAL: 1.1 mg/dL (ref 0.3–1.2)
BUN: 14 mg/dL (ref 6–20)
CHLORIDE: 111 mmol/L (ref 101–111)
CO2: 18 mmol/L — ABNORMAL LOW (ref 22–32)
CREATININE: 0.67 mg/dL (ref 0.44–1.00)
Calcium: 8.7 mg/dL — ABNORMAL LOW (ref 8.9–10.3)
Glucose, Bld: 107 mg/dL — ABNORMAL HIGH (ref 65–99)
Potassium: 3.6 mmol/L (ref 3.5–5.1)
Sodium: 137 mmol/L (ref 135–145)
TOTAL PROTEIN: 6.7 g/dL (ref 6.5–8.1)

## 2016-08-13 LAB — HEPARIN LEVEL (UNFRACTIONATED)

## 2016-08-13 LAB — RAPID URINE DRUG SCREEN, HOSP PERFORMED
Amphetamines: POSITIVE — AB
BARBITURATES: NOT DETECTED
BENZODIAZEPINES: POSITIVE — AB
COCAINE: NOT DETECTED
OPIATES: POSITIVE — AB
TETRAHYDROCANNABINOL: POSITIVE — AB

## 2016-08-13 LAB — GLUCOSE, CAPILLARY
GLUCOSE-CAPILLARY: 104 mg/dL — AB (ref 65–99)
Glucose-Capillary: 86 mg/dL (ref 65–99)
Glucose-Capillary: 107 mg/dL — ABNORMAL HIGH (ref 65–99)

## 2016-08-13 LAB — PROTIME-INR
INR: 1.06
INR: 1.14
PROTHROMBIN TIME: 13.8 s (ref 11.4–15.2)
PROTHROMBIN TIME: 14.7 s (ref 11.4–15.2)

## 2016-08-13 LAB — TROPONIN I
TROPONIN I: 1.71 ng/mL — AB (ref <0.03)
TROPONIN I: 0.95 ng/mL — AB (ref <0.03)
Troponin I: 1.36 ng/mL (ref <0.03)

## 2016-08-13 LAB — TSH: TSH: 8.6 u[IU]/mL — AB (ref 0.350–4.500)

## 2016-08-13 LAB — POCT ACTIVATED CLOTTING TIME: ACTIVATED CLOTTING TIME: 224 s

## 2016-08-13 LAB — HIV ANTIBODY (ROUTINE TESTING W REFLEX): HIV SCREEN 4TH GENERATION: NONREACTIVE

## 2016-08-13 LAB — ABO/RH: ABO/RH(D): A POS

## 2016-08-13 SURGERY — LEFT HEART CATH AND CORONARY ANGIOGRAPHY
Anesthesia: LOCAL

## 2016-08-13 SURGERY — CORONARY ARTERY BYPASS GRAFTING (CABG)
Anesthesia: General | Site: Chest

## 2016-08-13 MED ORDER — ALBUMIN HUMAN 5 % IV SOLN
250.0000 mL | INTRAVENOUS | Status: DC | PRN
  Administered 2016-08-13: 250 mL via INTRAVENOUS

## 2016-08-13 MED ORDER — CHLORHEXIDINE GLUCONATE 0.12% ORAL RINSE (MEDLINE KIT)
15.0000 mL | Freq: Two times a day (BID) | OROMUCOSAL | Status: DC
  Administered 2016-08-13: 15 mL via OROMUCOSAL

## 2016-08-13 MED ORDER — TRANEXAMIC ACID (OHS) PUMP PRIME SOLUTION
2.0000 mg/kg | INTRAVENOUS | Status: DC
  Filled 2016-08-13: qty 1.86

## 2016-08-13 MED ORDER — LIDOCAINE HCL (PF) 1 % IJ SOLN
INTRAMUSCULAR | Status: AC
  Filled 2016-08-13: qty 30

## 2016-08-13 MED ORDER — SODIUM CHLORIDE 0.9% FLUSH
3.0000 mL | Freq: Two times a day (BID) | INTRAVENOUS | Status: DC
  Administered 2016-08-14 – 2016-08-16 (×5): 3 mL via INTRAVENOUS

## 2016-08-13 MED ORDER — FAMOTIDINE IN NACL 20-0.9 MG/50ML-% IV SOLN
20.0000 mg | Freq: Two times a day (BID) | INTRAVENOUS | Status: AC
  Administered 2016-08-13 – 2016-08-14 (×2): 20 mg via INTRAVENOUS
  Filled 2016-08-13 (×2): qty 50

## 2016-08-13 MED ORDER — METOPROLOL TARTRATE 5 MG/5ML IV SOLN: 2.5000 mg | INTRAVENOUS | Status: DC | PRN

## 2016-08-13 MED ORDER — DEXMEDETOMIDINE HCL IN NACL 400 MCG/100ML IV SOLN
0.1000 ug/kg/h | INTRAVENOUS | Status: AC
  Administered 2016-08-13: .3 ug/kg/h via INTRAVENOUS
  Filled 2016-08-13: qty 100

## 2016-08-13 MED ORDER — DEXTROSE 5 % IV SOLN
1.5000 g | Freq: Two times a day (BID) | INTRAVENOUS | Status: AC
  Administered 2016-08-14 – 2016-08-15 (×4): 1.5 g via INTRAVENOUS
  Filled 2016-08-13 (×4): qty 1.5

## 2016-08-13 MED ORDER — PLASMA-LYTE 148 IV SOLN
INTRAVENOUS | Status: AC
  Administered 2016-08-13: 500 mL
  Filled 2016-08-13: qty 2.5

## 2016-08-13 MED ORDER — EPINEPHRINE PF 1 MG/ML IJ SOLN
0.0000 ug/min | INTRAMUSCULAR | Status: DC
  Filled 2016-08-13: qty 4

## 2016-08-13 MED ORDER — SODIUM CHLORIDE 0.9% FLUSH: 3.0000 mL | INTRAVENOUS | Status: DC | PRN

## 2016-08-13 MED ORDER — POTASSIUM CHLORIDE 2 MEQ/ML IV SOLN
80.0000 meq | INTRAVENOUS | Status: DC
  Filled 2016-08-13: qty 40

## 2016-08-13 MED ORDER — HEPARIN BOLUS VIA INFUSION
3000.0000 [IU] | Freq: Once | INTRAVENOUS | Status: AC
Start: 2016-08-13 — End: 2016-08-13
  Administered 2016-08-13: 3000 [IU] via INTRAVENOUS
  Filled 2016-08-13: qty 3000

## 2016-08-13 MED ORDER — MIDAZOLAM HCL 2 MG/2ML IJ SOLN: 2.0000 mg | INTRAMUSCULAR | Status: DC | PRN

## 2016-08-13 MED ORDER — IOPAMIDOL (ISOVUE-370) INJECTION 76%
INTRAVENOUS | Status: DC | PRN
  Administered 2016-08-13: 110 mL via INTRA_ARTERIAL

## 2016-08-13 MED ORDER — LIDOCAINE HCL (PF) 1 % IJ SOLN
INTRAMUSCULAR | Status: DC | PRN
  Administered 2016-08-13: 15 mL
  Administered 2016-08-13: 2 mL

## 2016-08-13 MED ORDER — PANTOPRAZOLE SODIUM 40 MG PO TBEC
40.0000 mg | DELAYED_RELEASE_TABLET | Freq: Every day | ORAL | Status: DC
  Administered 2016-08-15 – 2016-08-17 (×3): 40 mg via ORAL
  Filled 2016-08-13 (×3): qty 1

## 2016-08-13 MED ORDER — VERAPAMIL HCL 2.5 MG/ML IV SOLN
INTRAVENOUS | Status: AC
  Filled 2016-08-13: qty 2

## 2016-08-13 MED ORDER — BISACODYL 10 MG RE SUPP: 10.0000 mg | Freq: Every day | RECTAL | Status: DC

## 2016-08-13 MED ORDER — DOCUSATE SODIUM 100 MG PO CAPS
200.0000 mg | ORAL_CAPSULE | Freq: Every day | ORAL | Status: DC
  Administered 2016-08-14 – 2016-08-17 (×4): 200 mg via ORAL
  Filled 2016-08-13 (×4): qty 2

## 2016-08-13 MED ORDER — SODIUM CHLORIDE 0.9 % WEIGHT BASED INFUSION: 1.0000 mL/kg/h | INTRAVENOUS | Status: DC

## 2016-08-13 MED ORDER — NITROGLYCERIN 1 MG/10 ML FOR IR/CATH LAB
INTRA_ARTERIAL | Status: DC | PRN
  Administered 2016-08-13: 200 ug via INTRACORONARY

## 2016-08-13 MED ORDER — CHLORHEXIDINE GLUCONATE 0.12 % MT SOLN
15.0000 mL | OROMUCOSAL | Status: AC
  Administered 2016-08-13: 15 mL via OROMUCOSAL

## 2016-08-13 MED ORDER — PROTAMINE SULFATE 10 MG/ML IV SOLN
INTRAVENOUS | Status: DC | PRN
  Administered 2016-08-13: 150 mg via INTRAVENOUS

## 2016-08-13 MED ORDER — NITROGLYCERIN IN D5W 200-5 MCG/ML-% IV SOLN
2.0000 ug/min | INTRAVENOUS | Status: AC
  Administered 2016-08-13: 20 ug/min via INTRAVENOUS
  Filled 2016-08-13: qty 250

## 2016-08-13 MED ORDER — METOPROLOL TARTRATE 25 MG/10 ML ORAL SUSPENSION
12.5000 mg | Freq: Two times a day (BID) | ORAL | Status: DC
  Administered 2016-08-13: 12.5 mg
  Filled 2016-08-13: qty 5

## 2016-08-13 MED ORDER — FENTANYL CITRATE (PF) 100 MCG/2ML IJ SOLN
INTRAMUSCULAR | Status: DC | PRN
  Administered 2016-08-13 (×2): 25 ug via INTRAVENOUS
  Administered 2016-08-13: 50 ug via INTRAVENOUS
  Administered 2016-08-13: 25 ug via INTRAVENOUS

## 2016-08-13 MED ORDER — SODIUM CHLORIDE 0.9% FLUSH: 3.0000 mL | Freq: Two times a day (BID) | INTRAVENOUS | Status: DC

## 2016-08-13 MED ORDER — HEPARIN (PORCINE) IN NACL 2-0.9 UNIT/ML-% IJ SOLN
INTRAMUSCULAR | Status: AC
  Filled 2016-08-13: qty 1000

## 2016-08-13 MED ORDER — SODIUM CHLORIDE 0.9 % IV SOLN
INTRAVENOUS | Status: DC
  Filled 2016-08-13: qty 1

## 2016-08-13 MED ORDER — MAGNESIUM SULFATE 50 % IJ SOLN
40.0000 meq | INTRAMUSCULAR | Status: DC
  Filled 2016-08-13: qty 10

## 2016-08-13 MED ORDER — LACTATED RINGERS IV SOLN: 500.0000 mL | Freq: Once | INTRAVENOUS | Status: DC | PRN

## 2016-08-13 MED ORDER — SODIUM CHLORIDE 0.9 % IV SOLN
0.0000 ug/min | INTRAVENOUS | Status: DC
  Filled 2016-08-13: qty 2

## 2016-08-13 MED ORDER — BISACODYL 5 MG PO TBEC
10.0000 mg | DELAYED_RELEASE_TABLET | Freq: Every day | ORAL | Status: DC
  Administered 2016-08-14 – 2016-08-17 (×4): 10 mg via ORAL
  Filled 2016-08-13 (×4): qty 2

## 2016-08-13 MED ORDER — SODIUM BICARBONATE 8.4 % IV SOLN
50.0000 meq | Freq: Once | INTRAVENOUS | Status: AC
  Administered 2016-08-13: 50 meq via INTRAVENOUS

## 2016-08-13 MED ORDER — LACTATED RINGERS IV SOLN
INTRAVENOUS | Status: DC | PRN
  Administered 2016-08-13: 15:00:00 via INTRAVENOUS

## 2016-08-13 MED ORDER — DOPAMINE-DEXTROSE 3.2-5 MG/ML-% IV SOLN
0.0000 ug/kg/min | INTRAVENOUS | Status: DC
  Filled 2016-08-13: qty 250

## 2016-08-13 MED ORDER — METOPROLOL TARTRATE 12.5 MG HALF TABLET
12.5000 mg | ORAL_TABLET | Freq: Two times a day (BID) | ORAL | Status: DC
  Administered 2016-08-14 – 2016-08-16 (×5): 12.5 mg via ORAL
  Filled 2016-08-13 (×5): qty 1

## 2016-08-13 MED ORDER — DEXTROSE 5 % IV SOLN
1.5000 g | INTRAVENOUS | Status: AC
  Administered 2016-08-13: 1.5 g via INTRAVENOUS
  Filled 2016-08-13: qty 1.5

## 2016-08-13 MED ORDER — KENNESTONE BLOOD CARDIOPLEGIA (KBC) MANNITOL SYRINGE (20%, 32ML)
32.0000 mL | Freq: Once | INTRAVENOUS | Status: DC
  Filled 2016-08-13: qty 32

## 2016-08-13 MED ORDER — PROPOFOL 10 MG/ML IV BOLUS
INTRAVENOUS | Status: AC
  Filled 2016-08-13: qty 20

## 2016-08-13 MED ORDER — SODIUM CHLORIDE 0.9 % IV SOLN
INTRAVENOUS | Status: DC
  Filled 2016-08-13: qty 30

## 2016-08-13 MED ORDER — VERAPAMIL HCL 2.5 MG/ML IV SOLN
INTRAVENOUS | Status: DC | PRN
  Administered 2016-08-13: 10 mL via INTRA_ARTERIAL

## 2016-08-13 MED ORDER — SUCCINYLCHOLINE CHLORIDE 200 MG/10ML IV SOSY
PREFILLED_SYRINGE | INTRAVENOUS | Status: AC
  Filled 2016-08-13: qty 10

## 2016-08-13 MED ORDER — SODIUM CHLORIDE 0.9 % IJ SOLN
INTRAMUSCULAR | Status: AC
  Filled 2016-08-13: qty 10

## 2016-08-13 MED ORDER — MORPHINE SULFATE (PF) 4 MG/ML IV SOLN
1.0000 mg | INTRAVENOUS | Status: DC | PRN
  Administered 2016-08-14 – 2016-08-16 (×14): 2 mg via INTRAVENOUS
  Filled 2016-08-13 (×17): qty 1

## 2016-08-13 MED ORDER — OXYCODONE HCL 5 MG PO TABS
5.0000 mg | ORAL_TABLET | ORAL | Status: DC | PRN
  Administered 2016-08-14 – 2016-08-16 (×15): 10 mg via ORAL
  Administered 2016-08-16: 5 mg via ORAL
  Administered 2016-08-16 (×3): 10 mg via ORAL
  Administered 2016-08-17: 5 mg via ORAL
  Administered 2016-08-17: 10 mg via ORAL
  Filled 2016-08-13 (×16): qty 2
  Filled 2016-08-13: qty 1
  Filled 2016-08-13 (×4): qty 2

## 2016-08-13 MED ORDER — SODIUM CHLORIDE 0.9 % IV SOLN: 250.0000 mL | INTRAVENOUS | Status: DC | PRN

## 2016-08-13 MED ORDER — TRANEXAMIC ACID (OHS) BOLUS VIA INFUSION
15.0000 mg/kg | INTRAVENOUS | Status: AC
  Administered 2016-08-13: 1395 mg via INTRAVENOUS
  Administered 2016-08-13: 10 mg via INTRAVENOUS
  Administered 2016-08-13: 1395 mg via INTRAVENOUS
  Filled 2016-08-13: qty 1395

## 2016-08-13 MED ORDER — VANCOMYCIN HCL 10 G IV SOLR
1500.0000 mg | INTRAVENOUS | Status: AC
  Administered 2016-08-13: 1500 mg via INTRAVENOUS
  Filled 2016-08-13: qty 1500

## 2016-08-13 MED ORDER — INSULIN REGULAR BOLUS VIA INFUSION
0.0000 [IU] | Freq: Three times a day (TID) | INTRAVENOUS | Status: DC
  Filled 2016-08-13: qty 10

## 2016-08-13 MED ORDER — PHENYLEPHRINE HCL 10 MG/ML IJ SOLN
30.0000 ug/min | INTRAMUSCULAR | Status: AC
  Administered 2016-08-13: 10 ug/min via INTRAVENOUS
  Filled 2016-08-13: qty 2

## 2016-08-13 MED ORDER — ACETAMINOPHEN 500 MG PO TABS
1000.0000 mg | ORAL_TABLET | Freq: Four times a day (QID) | ORAL | Status: DC
  Administered 2016-08-14 – 2016-08-18 (×16): 1000 mg via ORAL
  Filled 2016-08-13 (×16): qty 2

## 2016-08-13 MED ORDER — SODIUM CHLORIDE 0.9 % IV SOLN
INTRAVENOUS | Status: AC
  Administered 2016-08-13: 1 [IU]/h via INTRAVENOUS
  Filled 2016-08-13: qty 1

## 2016-08-13 MED ORDER — MAGNESIUM SULFATE 4 GM/100ML IV SOLN
4.0000 g | Freq: Once | INTRAVENOUS | Status: AC
  Administered 2016-08-13: 4 g via INTRAVENOUS
  Filled 2016-08-13: qty 100

## 2016-08-13 MED ORDER — FENTANYL CITRATE (PF) 100 MCG/2ML IJ SOLN
INTRAMUSCULAR | Status: AC
  Filled 2016-08-13: qty 2

## 2016-08-13 MED ORDER — MIDAZOLAM HCL 2 MG/2ML IJ SOLN
INTRAMUSCULAR | Status: AC
  Filled 2016-08-13: qty 2

## 2016-08-13 MED ORDER — NITROGLYCERIN 1 MG/10 ML FOR IR/CATH LAB
INTRA_ARTERIAL | Status: AC
  Filled 2016-08-13: qty 10

## 2016-08-13 MED ORDER — ASPIRIN 81 MG PO CHEW: 81.0000 mg | CHEWABLE_TABLET | ORAL | Status: AC

## 2016-08-13 MED ORDER — DEXTROSE 5 % IV SOLN
750.0000 mg | INTRAVENOUS | Status: DC
  Filled 2016-08-13: qty 750

## 2016-08-13 MED ORDER — ROCURONIUM BROMIDE 10 MG/ML (PF) SYRINGE
PREFILLED_SYRINGE | INTRAVENOUS | Status: AC
  Filled 2016-08-13: qty 10

## 2016-08-13 MED ORDER — HEPARIN (PORCINE) IN NACL 100-0.45 UNIT/ML-% IJ SOLN: 1300.0000 [IU]/h | INTRAMUSCULAR | Status: DC

## 2016-08-13 MED ORDER — LIDOCAINE 2% (20 MG/ML) 5 ML SYRINGE
INTRAMUSCULAR | Status: DC | PRN
  Administered 2016-08-13: 100 mg via INTRAVENOUS

## 2016-08-13 MED ORDER — POTASSIUM CHLORIDE 10 MEQ/50ML IV SOLN: 10.0000 meq | INTRAVENOUS | Status: AC

## 2016-08-13 MED ORDER — SODIUM CHLORIDE 0.45 % IV SOLN
INTRAVENOUS | Status: DC | PRN
  Administered 2016-08-13: 18:00:00 via INTRAVENOUS

## 2016-08-13 MED ORDER — FENTANYL CITRATE (PF) 250 MCG/5ML IJ SOLN
INTRAMUSCULAR | Status: AC
  Filled 2016-08-13: qty 20

## 2016-08-13 MED ORDER — POTASSIUM CHLORIDE CRYS ER 20 MEQ PO TBCR
40.0000 meq | EXTENDED_RELEASE_TABLET | Freq: Once | ORAL | Status: AC
  Administered 2016-08-13: 40 meq via ORAL
  Filled 2016-08-13: qty 2

## 2016-08-13 MED ORDER — KENNESTONE BLOOD CARDIOPLEGIA VIAL
13.0000 mL | Freq: Once | Status: DC
  Filled 2016-08-13: qty 13

## 2016-08-13 MED ORDER — ACETAMINOPHEN 160 MG/5ML PO SOLN: 1000.0000 mg | Freq: Four times a day (QID) | ORAL | Status: DC

## 2016-08-13 MED ORDER — VANCOMYCIN HCL IN DEXTROSE 1-5 GM/200ML-% IV SOLN
1000.0000 mg | Freq: Once | INTRAVENOUS | Status: AC
  Administered 2016-08-14: 1000 mg via INTRAVENOUS
  Filled 2016-08-13: qty 200

## 2016-08-13 MED ORDER — HEPARIN SODIUM (PORCINE) 1000 UNIT/ML IJ SOLN
INTRAMUSCULAR | Status: DC | PRN
  Administered 2016-08-13: 2000 [IU] via INTRAVENOUS
  Administered 2016-08-13 (×2): 4500 [IU] via INTRAVENOUS

## 2016-08-13 MED ORDER — ORAL CARE MOUTH RINSE
15.0000 mL | Freq: Four times a day (QID) | OROMUCOSAL | Status: DC
  Administered 2016-08-14: 15 mL via OROMUCOSAL

## 2016-08-13 MED ORDER — SODIUM CHLORIDE 0.9 % IV SOLN: 250.0000 mL | INTRAVENOUS | Status: DC

## 2016-08-13 MED ORDER — ACETAMINOPHEN 160 MG/5ML PO SOLN: 650.0000 mg | Freq: Once | ORAL | Status: AC

## 2016-08-13 MED ORDER — TRANEXAMIC ACID 1000 MG/10ML IV SOLN
1.5000 mg/kg/h | INTRAVENOUS | Status: AC
  Administered 2016-08-13: 1.5 mg/kg/h via INTRAVENOUS
  Filled 2016-08-13: qty 25

## 2016-08-13 MED ORDER — ASPIRIN EC 325 MG PO TBEC
325.0000 mg | DELAYED_RELEASE_TABLET | Freq: Every day | ORAL | Status: DC
  Administered 2016-08-14 – 2016-08-16 (×3): 325 mg via ORAL
  Filled 2016-08-13 (×3): qty 1

## 2016-08-13 MED ORDER — SODIUM CHLORIDE 0.9 % WEIGHT BASED INFUSION
3.0000 mL/kg/h | INTRAVENOUS | Status: DC
  Administered 2016-08-13: 3 mL/kg/h via INTRAVENOUS

## 2016-08-13 MED ORDER — HEPARIN (PORCINE) IN NACL 2-0.9 UNIT/ML-% IJ SOLN
INTRAMUSCULAR | Status: AC | PRN
  Administered 2016-08-13: 1500 mL

## 2016-08-13 MED ORDER — ASPIRIN 81 MG PO CHEW: 324.0000 mg | CHEWABLE_TABLET | Freq: Every day | ORAL | Status: DC

## 2016-08-13 MED ORDER — SODIUM CHLORIDE 0.9 % IV SOLN
0.0000 ug/kg/h | INTRAVENOUS | Status: DC
  Filled 2016-08-13 (×2): qty 2

## 2016-08-13 MED ORDER — HEPARIN SODIUM (PORCINE) 1000 UNIT/ML IJ SOLN
INTRAMUSCULAR | Status: AC
  Filled 2016-08-13: qty 1

## 2016-08-13 MED ORDER — ROCURONIUM BROMIDE 10 MG/ML (PF) SYRINGE
PREFILLED_SYRINGE | INTRAVENOUS | Status: DC | PRN
  Administered 2016-08-13: 50 mg via INTRAVENOUS
  Administered 2016-08-13: 40 mg via INTRAVENOUS
  Administered 2016-08-13: 60 mg via INTRAVENOUS

## 2016-08-13 MED ORDER — FENTANYL CITRATE (PF) 250 MCG/5ML IJ SOLN
INTRAMUSCULAR | Status: DC | PRN
  Administered 2016-08-13 (×3): 100 ug via INTRAVENOUS
  Administered 2016-08-13: 50 ug via INTRAVENOUS
  Administered 2016-08-13: 100 ug via INTRAVENOUS
  Administered 2016-08-13: 150 ug via INTRAVENOUS
  Administered 2016-08-13: 250 ug via INTRAVENOUS
  Administered 2016-08-13: 50 ug via INTRAVENOUS
  Administered 2016-08-13: 100 ug via INTRAVENOUS
  Administered 2016-08-13: 150 ug via INTRAVENOUS
  Administered 2016-08-13: 100 ug via INTRAVENOUS
  Administered 2016-08-13: 250 ug via INTRAVENOUS

## 2016-08-13 MED ORDER — LIDOCAINE 2% (20 MG/ML) 5 ML SYRINGE
INTRAMUSCULAR | Status: AC
  Filled 2016-08-13: qty 5

## 2016-08-13 MED ORDER — SODIUM CHLORIDE 0.9 % IV SOLN
INTRAVENOUS | Status: DC
  Administered 2016-08-13: 20 mL/h via INTRAVENOUS

## 2016-08-13 MED ORDER — MORPHINE SULFATE (PF) 4 MG/ML IV SOLN
1.0000 mg | INTRAVENOUS | Status: DC | PRN
  Administered 2016-08-13: 2 mg via INTRAVENOUS
  Administered 2016-08-13 – 2016-08-14 (×2): 4 mg via INTRAVENOUS
  Filled 2016-08-13 (×2): qty 1

## 2016-08-13 MED ORDER — VANCOMYCIN HCL 1000 MG IV SOLR
INTRAVENOUS | Status: AC
  Administered 2016-08-13: 1000 mL
  Filled 2016-08-13: qty 1000

## 2016-08-13 MED ORDER — LACTATED RINGERS IV SOLN: INTRAVENOUS | Status: DC

## 2016-08-13 MED ORDER — IOPAMIDOL (ISOVUE-370) INJECTION 76%
INTRAVENOUS | Status: AC
  Filled 2016-08-13: qty 100

## 2016-08-13 MED ORDER — 0.9 % SODIUM CHLORIDE (POUR BTL) OPTIME
TOPICAL | Status: DC | PRN
  Administered 2016-08-13: 5000 mL

## 2016-08-13 MED ORDER — HEPARIN SODIUM (PORCINE) 1000 UNIT/ML IJ SOLN
INTRAMUSCULAR | Status: DC | PRN
  Administered 2016-08-13: 15000 [IU] via INTRAVENOUS

## 2016-08-13 MED ORDER — MIDAZOLAM HCL 10 MG/2ML IJ SOLN
INTRAMUSCULAR | Status: AC
  Filled 2016-08-13: qty 2

## 2016-08-13 MED ORDER — SODIUM CHLORIDE 0.9 % IV SOLN
INTRAVENOUS | Status: DC
  Administered 2016-08-13: 19:00:00 via INTRAVENOUS

## 2016-08-13 MED ORDER — ARTIFICIAL TEARS OPHTHALMIC OINT
TOPICAL_OINTMENT | OPHTHALMIC | Status: DC | PRN
  Administered 2016-08-13: 1 via OPHTHALMIC

## 2016-08-13 MED ORDER — MIDAZOLAM HCL 2 MG/2ML IJ SOLN
INTRAMUSCULAR | Status: DC | PRN
  Administered 2016-08-13 (×2): 1 mg via INTRAVENOUS

## 2016-08-13 MED ORDER — TRAMADOL HCL 50 MG PO TABS
50.0000 mg | ORAL_TABLET | ORAL | Status: DC | PRN
  Administered 2016-08-15 – 2016-08-16 (×6): 100 mg via ORAL
  Filled 2016-08-13 (×6): qty 2

## 2016-08-13 MED ORDER — PROPRANOLOL HCL 1 MG/ML IV SOLN
1.0000 mg | Freq: Once | INTRAVENOUS | Status: DC
  Filled 2016-08-13: qty 1

## 2016-08-13 MED ORDER — ONDANSETRON HCL 4 MG/2ML IJ SOLN
4.0000 mg | Freq: Four times a day (QID) | INTRAMUSCULAR | Status: DC | PRN
  Administered 2016-08-15 – 2016-08-18 (×2): 4 mg via INTRAVENOUS
  Filled 2016-08-13 (×2): qty 2

## 2016-08-13 MED ORDER — INSULIN ASPART 100 UNIT/ML ~~LOC~~ SOLN
0.0000 [IU] | SUBCUTANEOUS | Status: DC
  Administered 2016-08-14 – 2016-08-16 (×8): 2 [IU] via SUBCUTANEOUS

## 2016-08-13 MED ORDER — ACETAMINOPHEN 650 MG RE SUPP
650.0000 mg | Freq: Once | RECTAL | Status: AC
  Administered 2016-08-13: 650 mg via RECTAL

## 2016-08-13 MED ORDER — MIDAZOLAM HCL 5 MG/5ML IJ SOLN
INTRAMUSCULAR | Status: DC | PRN
  Administered 2016-08-13 (×2): 2 mg via INTRAVENOUS
  Administered 2016-08-13: 1 mg via INTRAVENOUS
  Administered 2016-08-13: 3 mg via INTRAVENOUS
  Administered 2016-08-13 (×2): 2 mg via INTRAVENOUS

## 2016-08-13 MED ORDER — NITROGLYCERIN IN D5W 200-5 MCG/ML-% IV SOLN: 0.0000 ug/min | INTRAVENOUS | Status: DC

## 2016-08-13 MED ORDER — CLOPIDOGREL BISULFATE 75 MG PO TABS
75.0000 mg | ORAL_TABLET | Freq: Every day | ORAL | Status: DC
  Administered 2016-08-14 – 2016-08-17 (×4): 75 mg via ORAL
  Filled 2016-08-13 (×4): qty 1

## 2016-08-13 MED ORDER — SODIUM CHLORIDE 0.9 % IJ SOLN
OROMUCOSAL | Status: DC | PRN
  Administered 2016-08-13 (×4): 4 mL via TOPICAL

## 2016-08-13 MED ORDER — PROPOFOL 10 MG/ML IV BOLUS
INTRAVENOUS | Status: DC | PRN
  Administered 2016-08-13: 40 mg via INTRAVENOUS
  Administered 2016-08-13: 160 mg via INTRAVENOUS
  Administered 2016-08-13 (×2): 20 mg via INTRAVENOUS

## 2016-08-13 SURGICAL SUPPLY — 108 items
BAG DECANTER FOR FLEXI CONT (MISCELLANEOUS) ×6 IMPLANT
BANDAGE ACE 4X5 VEL STRL LF (GAUZE/BANDAGES/DRESSINGS) IMPLANT
BANDAGE ACE 6X5 VEL STRL LF (GAUZE/BANDAGES/DRESSINGS) IMPLANT
BASKET HEART (ORDER IN 25'S) (MISCELLANEOUS)
BASKET HEART (ORDER IN 25S) (MISCELLANEOUS) IMPLANT
BLADE CLIPPER SURG (BLADE) ×3 IMPLANT
BLADE STERNUM SYSTEM 6 (BLADE) ×3 IMPLANT
BLOWER MISTER CAL-MED (MISCELLANEOUS) ×3 IMPLANT
BNDG GAUZE ELAST 4 BULKY (GAUZE/BANDAGES/DRESSINGS) IMPLANT
CANISTER SUCT 3000ML PPV (MISCELLANEOUS) ×3 IMPLANT
CANNULA EZ GLIDE AORTIC 21FR (CANNULA) ×3 IMPLANT
CATH CPB KIT OWEN (MISCELLANEOUS) ×3 IMPLANT
CATH THORACIC 36FR (CATHETERS) ×3 IMPLANT
CLIP RETRACTION 3.0MM CORONARY (MISCELLANEOUS) ×3 IMPLANT
CLIP VESOCCLUDE MED 24/CT (CLIP) IMPLANT
CLIP VESOCCLUDE SM WIDE 24/CT (CLIP) IMPLANT
CONN ST 1/4X3/8  BEN (MISCELLANEOUS) ×1
CONN ST 1/4X3/8 BEN (MISCELLANEOUS) ×2 IMPLANT
CONT SPEC 4OZ CLIKSEAL STRL BL (MISCELLANEOUS) ×3 IMPLANT
CRADLE DONUT ADULT HEAD (MISCELLANEOUS) ×3 IMPLANT
DRAIN CHANNEL 32F RND 10.7 FF (WOUND CARE) ×6 IMPLANT
DRAPE CARDIOVASCULAR INCISE (DRAPES) ×1
DRAPE HALF SHEET 40X57 (DRAPES) ×3 IMPLANT
DRAPE INCISE IOBAN 66X45 STRL (DRAPES) ×3 IMPLANT
DRAPE SLUSH/WARMER DISC (DRAPES) ×3 IMPLANT
DRAPE SRG 135X102X78XABS (DRAPES) ×2 IMPLANT
DRSG AQUACEL AG ADV 3.5X14 (GAUZE/BANDAGES/DRESSINGS) ×3 IMPLANT
DRSG COVADERM 4X14 (GAUZE/BANDAGES/DRESSINGS) IMPLANT
ELECT BLADE 4.0 EZ CLEAN MEGAD (MISCELLANEOUS) ×3
ELECT REM PT RETURN 9FT ADLT (ELECTROSURGICAL) ×6
ELECTRODE BLDE 4.0 EZ CLN MEGD (MISCELLANEOUS) ×2 IMPLANT
ELECTRODE REM PT RTRN 9FT ADLT (ELECTROSURGICAL) ×4 IMPLANT
FELT TEFLON 1X6 (MISCELLANEOUS) ×3 IMPLANT
GAUZE SPONGE 4X4 12PLY STRL (GAUZE/BANDAGES/DRESSINGS) ×3 IMPLANT
GAUZE SPONGE 4X4 12PLY STRL LF (GAUZE/BANDAGES/DRESSINGS) ×3 IMPLANT
GLOVE BIO SURGEON STRL SZ7.5 (GLOVE) ×3 IMPLANT
GLOVE BIOGEL PI IND STRL 6.5 (GLOVE) ×10 IMPLANT
GLOVE BIOGEL PI INDICATOR 6.5 (GLOVE) ×5
GLOVE ORTHO TXT STRL SZ7.5 (GLOVE) ×6 IMPLANT
GOWN STRL REUS W/ TWL LRG LVL3 (GOWN DISPOSABLE) ×10 IMPLANT
GOWN STRL REUS W/TWL LRG LVL3 (GOWN DISPOSABLE) ×5
HEMOSTAT POWDER SURGIFOAM 1G (HEMOSTASIS) ×12 IMPLANT
INSERT FOGARTY XLG (MISCELLANEOUS) ×3 IMPLANT
KIT BASIN OR (CUSTOM PROCEDURE TRAY) ×3 IMPLANT
KIT ROOM TURNOVER OR (KITS) ×3 IMPLANT
KIT SUCTION CATH 14FR (SUCTIONS) ×9 IMPLANT
KIT VASOVIEW HEMOPRO VH 3000 (KITS) IMPLANT
LEAD PACING MYOCARDI (MISCELLANEOUS) ×3 IMPLANT
MARKER GRAFT CORONARY BYPASS (MISCELLANEOUS) IMPLANT
NS IRRIG 1000ML POUR BTL (IV SOLUTION) ×15 IMPLANT
OFFPUMP STABILIZER SUV (MISCELLANEOUS) ×3 IMPLANT
PACK OPEN HEART (CUSTOM PROCEDURE TRAY) ×3 IMPLANT
PAD ARMBOARD 7.5X6 YLW CONV (MISCELLANEOUS) ×6 IMPLANT
PAD ELECT DEFIB RADIOL ZOLL (MISCELLANEOUS) ×3 IMPLANT
PENCIL BUTTON HOLSTER BLD 10FT (ELECTRODE) IMPLANT
POSITIONER ACROBAT-I OFFPUMP (MISCELLANEOUS) ×3 IMPLANT
PUNCH AORTIC ROTATE 4.0MM (MISCELLANEOUS) IMPLANT
PUNCH AORTIC ROTATE 4.5MM 8IN (MISCELLANEOUS) IMPLANT
PUNCH AORTIC ROTATE 5MM 8IN (MISCELLANEOUS) IMPLANT
SET CARDIOPLEGIA MPS 5001102 (MISCELLANEOUS) ×3 IMPLANT
SOLUTION ANTI FOG 6CC (MISCELLANEOUS) IMPLANT
SPONGE LAP 18X18 X RAY DECT (DISPOSABLE) IMPLANT
SPONGE LAP 4X18 X RAY DECT (DISPOSABLE) IMPLANT
SUT BONE WAX W31G (SUTURE) ×3 IMPLANT
SUT ETHIBOND X763 2 0 SH 1 (SUTURE) ×9 IMPLANT
SUT MNCRL AB 3-0 PS2 18 (SUTURE) ×6 IMPLANT
SUT MNCRL AB 4-0 PS2 18 (SUTURE) IMPLANT
SUT PDS AB 1 CTX 36 (SUTURE) ×6 IMPLANT
SUT PROLENE 2 0 SH DA (SUTURE) IMPLANT
SUT PROLENE 3 0 SH DA (SUTURE) ×3 IMPLANT
SUT PROLENE 3 0 SH1 36 (SUTURE) IMPLANT
SUT PROLENE 4 0 RB 1 (SUTURE)
SUT PROLENE 4 0 SH DA (SUTURE) IMPLANT
SUT PROLENE 4-0 RB1 .5 CRCL 36 (SUTURE) IMPLANT
SUT PROLENE 5 0 C 1 36 (SUTURE) IMPLANT
SUT PROLENE 6 0 C 1 30 (SUTURE) IMPLANT
SUT PROLENE 7.0 RB 3 (SUTURE) ×9 IMPLANT
SUT PROLENE 8 0 BV175 6 (SUTURE) ×3 IMPLANT
SUT PROLENE BLUE 7 0 (SUTURE) ×3 IMPLANT
SUT PROLENE POLY MONO (SUTURE) IMPLANT
SUT SILK  1 MH (SUTURE) ×3
SUT SILK 1 MH (SUTURE) ×6 IMPLANT
SUT STEEL 6MS V (SUTURE) IMPLANT
SUT STEEL STERNAL CCS#1 18IN (SUTURE) ×3 IMPLANT
SUT STEEL SZ 6 DBL 3X14 BALL (SUTURE) ×6 IMPLANT
SUT VIC AB 1 CTX 36 (SUTURE)
SUT VIC AB 1 CTX36XBRD ANBCTR (SUTURE) IMPLANT
SUT VIC AB 2-0 CT1 27 (SUTURE)
SUT VIC AB 2-0 CT1 TAPERPNT 27 (SUTURE) IMPLANT
SUT VIC AB 2-0 CTX 27 (SUTURE) IMPLANT
SUT VIC AB 3-0 SH 27 (SUTURE)
SUT VIC AB 3-0 SH 27X BRD (SUTURE) IMPLANT
SUT VIC AB 3-0 X1 27 (SUTURE) IMPLANT
SUT VICRYL 4-0 PS2 18IN ABS (SUTURE) IMPLANT
SUTURE E-PAK OPEN HEART (SUTURE) ×3 IMPLANT
SYR CONTROL 10ML LL (SYRINGE) ×3 IMPLANT
SYSTEM SAHARA CHEST DRAIN ATS (WOUND CARE) ×3 IMPLANT
TAPE CLOTH SURG 4X10 WHT LF (GAUZE/BANDAGES/DRESSINGS) ×3 IMPLANT
TAPE PAPER 2X10 WHT MICROPORE (GAUZE/BANDAGES/DRESSINGS) ×3 IMPLANT
TOWEL GREEN STERILE (TOWEL DISPOSABLE) ×3 IMPLANT
TOWEL GREEN STERILE FF (TOWEL DISPOSABLE) IMPLANT
TOWEL OR 17X24 6PK STRL BLUE (TOWEL DISPOSABLE) IMPLANT
TOWEL OR 17X26 10 PK STRL BLUE (TOWEL DISPOSABLE) IMPLANT
TRAY FOLEY SILVER 16FR TEMP (SET/KITS/TRAYS/PACK) ×3 IMPLANT
TUBE CONNECTING 20X1/4 (TUBING) ×3 IMPLANT
TUBING INSUFFLATION (TUBING) IMPLANT
UNDERPAD 30X30 (UNDERPADS AND DIAPERS) ×3 IMPLANT
WATER STERILE IRR 1000ML POUR (IV SOLUTION) ×6 IMPLANT

## 2016-08-13 SURGICAL SUPPLY — 19 items
BALLN LINEAR 7.5FR IABP 40CC (BALLOONS) ×2
BALLOON LINEAR 7.5FR IABP 40CC (BALLOONS) ×1 IMPLANT
CATH 5FR JL3.5 JR4 ANG PIG MP (CATHETERS) ×2 IMPLANT
CATH LAUNCHER 6FR EBU3.5 (CATHETERS) ×2 IMPLANT
CATH OPTICROSS 40MHZ (CATHETERS) ×2 IMPLANT
DEVICE RAD COMP TR BAND LRG (VASCULAR PRODUCTS) ×2 IMPLANT
DEVICE SECURE STATLOCK IABP (MISCELLANEOUS) ×4 IMPLANT
GLIDESHEATH SLEND A-KIT 6F 22G (SHEATH) ×2 IMPLANT
GUIDEWIRE INQWIRE 1.5J.035X260 (WIRE) ×1 IMPLANT
INQWIRE 1.5J .035X260CM (WIRE) ×2
KIT ESSENTIALS PG (KITS) ×2 IMPLANT
KIT HEART LEFT (KITS) ×2 IMPLANT
KIT MICROINTRODUCER STIFF 5F (SHEATH) ×2 IMPLANT
PACK CARDIAC CATHETERIZATION (CUSTOM PROCEDURE TRAY) ×2 IMPLANT
SLED PULL BACK IVUS (MISCELLANEOUS) ×2 IMPLANT
SYR MEDRAD MARK V 150ML (SYRINGE) ×2 IMPLANT
TRANSDUCER W/STOPCOCK (MISCELLANEOUS) ×2 IMPLANT
TUBING CIL FLEX 10 FLL-RA (TUBING) ×2 IMPLANT
WIRE COUGAR XT STRL 190CM (WIRE) ×2 IMPLANT

## 2016-08-13 NOTE — Progress Notes (Signed)
Chaplain responded to page for this patient requesting prayer for surgery today. Patient expresses that she is nervous about the prayer.  Patient welcomes prayer and is appreciative of presence.  Patient states that she is [place].  Chaplain prays with patient and is happy to return as needed or desire by patient.  Please page Chaplain.  Ministry of presence and prayer provided.    08/13/16 1158  Clinical Encounter Type  Visited With Patient  Visit Type Initial;Spiritual support;Pre-op  Spiritual Encounters  Spiritual Needs Prayer

## 2016-08-13 NOTE — Transfer of Care (Signed)
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  LIMA TO LAD (N/A) TRANSESOPHAGEAL ECHOCARDIOGRAM (TEE) (N/A)  Patient Location: SICU  Anesthesia Type:General  Level of Consciousness: sedated, unresponsive and Patient remains intubated per anesthesia plan  Airway & Oxygen Therapy: Patient remains intubated per anesthesia plan  Post-op Assessment: Report given to RN and Post -op Vital signs reviewed and stable  Post vital signs: Reviewed and stable  Last Vitals:  Vitals:   08/13/16 1345 08/13/16 1400  BP:  124/81  Pulse: (!) 205 (!) 213  Resp: 12 11  Temp:      Last Pain:  Vitals:   08/13/16 1145  TempSrc:   PainSc: 5       Patients Stated Pain Goal: 1 (08/13/16 1045)  Complications: No apparent anesthesia complications

## 2016-08-13 NOTE — Progress Notes (Signed)
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  LIMA TO LAD (N/A) TRANSESOPHAGEAL ECHOCARDIOGRAM (TEE) (N/A)   Sedated on vent NSR w/ stable hemodynamics, no drips O2 sats 97% on 50% FiO2 Chest tube output low UOP adequate Labs okay  Plan: Continue routine early postop  Purcell Nailslarence H Owen, MD 08/13/2016 6:02 PM

## 2016-08-13 NOTE — Anesthesia Procedure Notes (Addendum)
Central Venous Catheter Insertion Performed by: Sharee HolsterMASSAGEE, Catherine Goodman, anesthesiologist Start/End7/26/2018 2:45 PM, 08/13/2016 3:00 PM Patient location: Pre-op. Preanesthetic checklist: patient identified, IV checked, site marked, risks and benefits discussed, surgical consent, monitors and equipment checked, pre-op evaluation, timeout performed and anesthesia consent Position: Trendelenburg Lidocaine 1% used for infiltration and patient sedated Hand hygiene performed  and maximum sterile barriers used  Catheter size: 8.5 Fr PA cath was placed.Sheath introducer Procedure performed using ultrasound guided technique. Ultrasound Notes:anatomy identified, needle tip was noted to be adjacent to the nerve/plexus identified, no ultrasound evidence of intravascular and/or intraneural injection and image(s) printed for medical record Attempts: 1 Following insertion, line sutured and dressing applied. Post procedure assessment: blood return through all ports, free fluid flow and no air  Patient tolerated the procedure well with no immediate complications.

## 2016-08-13 NOTE — Anesthesia Procedure Notes (Addendum)
Procedure Name: Intubation Date/Time: 08/13/2016 2:41 PM Performed by: Merrilyn Puma B Pre-anesthesia Checklist: Emergency Drugs available, Patient identified, Suction available, Patient being monitored and Timeout performed Patient Re-evaluated:Patient Re-evaluated prior to induction Oxygen Delivery Method: Circle system utilized Preoxygenation: Pre-oxygenation with 100% oxygen Induction Type: IV induction Ventilation: Mask ventilation without difficulty Laryngoscope Size: Mac and 3 Grade View: Grade I Tube type: Oral Tube size: 7.5 mm Number of attempts: 1 Airway Equipment and Method: Stylet Placement Confirmation: ETT inserted through vocal cords under direct vision,  positive ETCO2,  CO2 detector and breath sounds checked- equal and bilateral Secured at: 21 cm Tube secured with: Tape Dental Injury: Teeth and Oropharynx as per pre-operative assessment

## 2016-08-13 NOTE — Progress Notes (Signed)
CRITICAL VALUE ALERT  Critical value received:  Troponin 1.71  Date of notification:  08/13/16  Time of notification:  0006  Critical value read back:Yes.    Nurse who received alert:  Eunice Blaseracy Keera Altidor, RN  MD notified (1st page):  Dr. Donnie Ahoilley    Time of first page:  0010  Responding MD:  Dr. Donnie Ahoilley  Time MD responded:  [phone]

## 2016-08-13 NOTE — Consult Note (Signed)
[address]       [place] [address]             [phone]        Catherine Goodman [place] Medical Record #[id number] Date of Birth: [birth year]  Referring: No ref. provider found Primary Care: Patient, No Pcp Per  Chief Complaint:  Chest pain  History of Present Illness:   The patient is a 36 year old female who presented to the emergency department last evening. She does have a known family history of cardiac disease with father having previous bypass surgery. She reports that she has been under significant stress recently. She admits to previous cocaine use in the past and yesterday token Adderall for energy. She developed midsternal chest pain that felt as though somebody was sitting on her chest. She presented to the emergency department at [facility] where EKG showed some T-wave abnormalities in V2 and V3 and additionally there was an abnormal troponin. She has ruled in for non-STEMI. She was transferred to [facility] for further evaluation and treatment. She was placed on nitroglycerin and became pain-free. She underwent cardiac catheterization today and was found to have significant ostial LAD disease. An intra-aortic balloon pump has been placed at 1:1 and she has been referred to CT surgery for consideration of emergent CABG. For full details of the cath please see below. Cardiac risk factors include tobacco abuse, obesity and hypercholesterolemia. Her TSH is noted to be elevated but has no history of thyroid disease. Blood sugars are mildly elevated so may have some insulin resistance, no HgBA1C done.     Current Activity/ Functional Status: Patient is independent with mobility/ambulation, transfers, ADL's, IADL's.   Zubrod Score: At the time of surgery this patient's most appropriate activity status/level should be described as: [x]     0    Normal activity, no symptoms []     1    Restricted in physical strenuous activity but ambulatory,  able to do out light work []     2    Ambulatory and capable of self care, unable to do work activities, up and about                 more than 50%  Of the time                            []     3    Only limited self care, in bed greater than 50% of waking hours []     4    Completely disabled, no self care, confined to bed or chair []     5    Moribund  Past Medical History:  Diagnosis Date  . Anxiety   . Depression   . NSTEMI (non-ST elevated myocardial infarction) ([place]) 08/12/2016  . Obesity (BMI 30-39.9)     Past Surgical History:  Procedure Laterality Date  . ADENOIDECTOMY  2002  . CESAREAN SECTION  2010  . IABP INSERTION N/A 08/13/2016   Procedure: IABP Insertion;  Surgeon: Nelva Bush, MD;  Location: [place] [facility];  Service: Cardiovascular;  Laterality: N/A;  . INGUINAL HERNIA REPAIR Right ~ 2012   w/mesh  . INTRAVASCULAR ULTRASOUND/IVUS N/A 08/13/2016   Procedure: Intravascular Ultrasound/IVUS;  Surgeon: Nelva Bush, MD;  Location: [place] [facility];  Service: Cardiovascular;  Laterality: N/A;  . LEFT HEART CATH AND CORONARY ANGIOGRAPHY N/A 08/13/2016   Procedure: Left Heart Cath  and Coronary Angiography;  Surgeon: Nelva Bush, MD;  Location: [place] [facility];  Service: Cardiovascular;  Laterality: N/A;  . TONSILLECTOMY  1992  . TUBAL LIGATION  2010    History  Smoking Status  . Current Every Day Smoker  . Packs/day: 1.00  . Years: 24.00  . Types: Cigarettes  Smokeless Tobacco  . Never Used    History  Alcohol Use  . 36.0 oz/week  . 12 Cans of beer, 48 Shots of liquor per week    Comment: 08/12/2016 "1/5th of liquor at least 3 times/week"    Social History   Social History  . Marital status: Married    Spouse name: N/A  . Number of children: N/A  . Years of education: N/A   Occupational History  . Not on file.   Social History Main Topics  . Smoking status: Current Every Day Smoker    Packs/day: 1.00    Years: 24.00    Types:  Cigarettes  . Smokeless tobacco: Never Used  . Alcohol use 36.0 oz/week    12 Cans of beer, 48 Shots of liquor per week     Comment: 08/12/2016 "1/5th of liquor at least 3 times/week"  . Drug use: Yes    Types: Marijuana, Other-see comments, Cocaine     Comment: 08/12/2016 "marijuana qd; I have took some Aderol; used cocaine 2 months ago; last used Xanax ~ 2 months ago also"  . Sexual activity: Yes    Birth control/ protection: Surgical   Other Topics Concern  . Not on file   Social History Narrative   Married for 15 years, has 4 children    Allergies  Allergen Reactions  . No Known Allergies     Current Facility-Administered Medications  Medication Dose Route Frequency Provider Last Rate Last Dose  . 0.9 %  sodium chloride infusion  250 mL Intravenous PRN End, Harrell Gave, MD      . acetaminophen (TYLENOL) tablet 650 mg  650 mg Oral Q4H PRN Jacolyn Reedy, MD   650 mg at 08/13/16 1044  . aspirin EC tablet 81 mg  81 mg Oral Daily Jacolyn Reedy, MD   81 mg at 08/13/16 0640  . atorvastatin (LIPITOR) tablet 40 mg  40 mg Oral q1800 Jacolyn Reedy, MD   40 mg at 08/12/16 2253  . cefUROXime (ZINACEF) 1.5 g in dextrose 5 % 50 mL IVPB  1.5 g Intravenous To OR Rexene Alberts, MD      . cefUROXime (ZINACEF) 750 mg in dextrose 5 % 50 mL IVPB  750 mg Intravenous To OR Rexene Alberts, MD      . dexmedetomidine (PRECEDEX) 400 MCG/100ML (4 mcg/mL) infusion  0.1-0.7 mcg/kg/hr Intravenous To OR Rexene Alberts, MD      . DOPamine (INTROPIN) 800 mg in dextrose 5 % 250 mL (3.2 mg/mL) infusion  0-10 mcg/kg/min Intravenous To OR Rexene Alberts, MD      . EPINEPHrine (ADRENALIN) 4 mg in dextrose 5 % 250 mL (0.016 mg/mL) infusion  0-10 mcg/min Intravenous To OR Rexene Alberts, MD      . heparin 2,500 Units, papaverine 30 mg in electrolyte-148 (PLASMALYTE-148) 500 mL irrigation   Irrigation To OR Rexene Alberts, MD      . heparin 30,000 units/NS 1000 mL solution for CELLSAVER   Other To  OR Rexene Alberts, MD      . heparin ADULT infusion 100 units/mL (25000 units/253m sodium chloride 0.45%)  1,300 Units/hr Intravenous Continuous Einar Grad, [facility]      . insulin regular (NOVOLIN R,HUMULIN R) 100 Units in sodium chloride 0.9 % 100 mL (1 Units/mL) infusion   Intravenous To OR Rexene Alberts, MD      . Burgess Amor Blood Cardioplegia [facility]) lidocaine 2% Syringe (28m)  13 mL Intracoronary Once ORexene Alberts MD      . KBurgess AmorBlood Cardioplegia ([facility] lidocaine 2% Syringe (125m  13 mL Intracoronary Once OwRexene AlbertsMD      . KeBurgess Amorlood Cardioplegia ([facility]) mannitol 20% Syringe (3239m 32 mL Intracoronary Once OweRexene AlbertsD      . KenBurgess Amorood Cardioplegia ([facility]) mannitol 20% Syringe (38m66m32 mL Intracoronary Once OwenRexene Alberts      . magnesium sulfate (IV Push/IM) injection 40 mEq  40 mEq Other To OR OwenRexene Alberts      . nitroGLYCERIN (NITROSTAT) SL tablet 0.4 mg  0.4 mg Sublingual Q5 Min x 3 PRN TillJacolyn Reedy      . nitroGLYCERIN 50 mg in dextrose 5 % 250 mL (0.2 mg/mL) infusion  0-200 mcg/min Intravenous Titrated TillJacolyn Reedy 6 mL/hr at 08/13/16 0813 20 mcg/min at 08/13/16 0813  . nitroGLYCERIN 50 mg in dextrose 5 % 250 mL (0.2 mg/mL) infusion  2-200 mcg/min Intravenous To OR OwenRexene Alberts      . ondansetron (ZOFHarris County Psychiatric Centerjection 4 mg  4 mg Intravenous Q6H PRN TillJacolyn Reedy      . phenylephrine (NEO-SYNEPHRINE) 20 mg in sodium chloride 0.9 % 250 mL (0.08 mg/mL) infusion  30-200 mcg/min Intravenous To OR OwenRexene Alberts      . potassium chloride injection 80 mEq  80 mEq Other To OR OwenRexene Alberts      . sodium chloride flush (NS) 0.9 % injection 3 mL  3 mL Intravenous Q12H End, Christopher, MD      . sodium chloride flush (NS) 0.9 % injection 3 mL  3 mL Intravenous PRN End, ChriHarrell Gave      . tranexamic acid (CYKLOKAPRON) 2,500 mg in sodium chloride 0.9 % 250 mL (10 mg/mL) infusion  1.5  mg/kg/hr Intravenous To OR OwenRexene Alberts      . tranexamic acid (CYKLOKAPRON) bolus via infusion - over 30 minutes 1,395 mg  15 mg/kg Intravenous To OR OwenRexene Alberts      . tranexamic acid (CYKLOKAPRON) pump prime solution 186 mg  2 mg/kg Intracatheter To OR OwenRexene Alberts      . vancomycin (VANCOCIN) 1,000 mg in sodium chloride 0.9 % 1,000 mL irrigation   Irrigation To OR OwenRexene Alberts      . vancomycin (VANCOCIN) 1,500 mg in sodium chloride 0.9 % 250 mL IVPB  1,500 mg Intravenous To OR OwenRexene Alberts        Prescriptions Prior to Admission  Medication Sig Dispense Refill Last Dose  . ibuprofen (ADVIL,MOTRIN) 600 MG tablet Take 1 tablet (600 mg total) by mouth every 6 (six) hours as needed. 30 tablet 0   . traMADol (ULTRAM) 50 MG tablet Take 1 tablet (50 mg total) by mouth every 6 (six) hours as needed. 20 tablet 0     History reviewed. No pertinent family history.   Review of Systems:    Review of Systems  Constitutional: Positive for diaphoresis and malaise/fatigue. Negative for chills, fever and weight loss.  HENT: Positive for ear pain. Negative for nosebleeds, sinus pain and sore throat.   Eyes: Negative.   Respiratory: Positive for shortness of breath. Negative for cough, hemoptysis, sputum production, wheezing and stridor.   Cardiovascular: Positive for chest pain, palpitations and orthopnea. Negative for claudication, leg swelling and PND.  Gastrointestinal: Negative for abdominal pain, blood in stool, constipation, diarrhea, heartburn, melena, nausea and vomiting.  Genitourinary: Negative.   Musculoskeletal: Positive for myalgias. Negative for back pain, falls, joint pain and neck pain.  Skin: Positive for itching.  Neurological: Positive for weakness. Negative for dizziness, tingling, tremors, sensory change, speech change, focal weakness, seizures, loss of consciousness and headaches.  Endo/Heme/Allergies: Positive for environmental  allergies. Negative for polydipsia. Does not bruise/bleed easily.  Psychiatric/Behavioral: Positive for depression, substance abuse and suicidal ideas. Negative for hallucinations and memory loss. The patient is nervous/anxious. The patient does not have insomnia.        Suicidal ideas 3 years ago      Physical Exam: BP 114/65   Pulse (!) 195   Temp 98.2 F (36.8 C) (Oral)   Resp (!) 24   Ht 5' 6"  (1.676 m)   Wt 205 lb 0.4 oz (93 kg)   SpO2 95%   BMI 33.09 kg/m   Physical Exam  Constitutional: She is oriented to person, place, and time. She appears well-developed and well-nourished. No distress.  HENT:  Head: Normocephalic and atraumatic.  Mouth/Throat: Oropharynx is clear and moist. No oropharyngeal exudate.  Eyes: Pupils are equal, round, and reactive to light. Conjunctivae are normal. No scleral icterus.  Neck: Normal range of motion. No JVD present. No tracheal deviation present. No thyromegaly present.  Cardiovascular: Normal rate, regular rhythm, normal heart sounds and intact distal pulses.  Exam reveals no gallop and no friction rub.   No murmur heard. Pulmonary/Chest: No respiratory distress. She has no wheezes. She has no rales. She exhibits no tenderness.  Abdominal: She exhibits no distension and no mass. There is no tenderness. There is no rebound and no guarding.  Lymphadenopathy:    She has no cervical adenopathy.  Neurological: She is alert and oriented to person, place, and time. She exhibits normal muscle tone. Coordination normal.  Skin: No rash noted. She is diaphoretic. No erythema. No pallor.  Psychiatric: She has a normal mood and affect. Her behavior is normal. Judgment and thought content normal.     Diagnostic Studies & Laboratory data:     Recent Radiology Findings:   Dg Chest Port 1 View  Result Date: 08/13/2016 CLINICAL DATA:  Preop for CABG, on intra aortic balloon pump assist. EXAM: PORTABLE CHEST 1 VIEW COMPARISON:  None. FINDINGS:  Intra-aortic balloon pump noted in the proximal descending thoracic aorta. The cardiomediastinal silhouette is normal in size. Normal pulmonary vascularity. No focal consolidation, pleural effusion, or pneumothorax. No acute osseous abnormality. IMPRESSION: Intra-aortic balloon pump in appropriate position. No active cardiopulmonary disease. Electronically Signed   By: Titus Dubin M.D.   On: 08/13/2016 10:25     I have independently reviewed the above radiologic studies.  Recent Lab Findings: Lab Results  Component Value Date   WBC 7.7 08/13/2016   HGB 12.0 08/13/2016   HCT 37.3 08/13/2016   PLT 283 08/13/2016   GLUCOSE 107 (H) 08/13/2016   CHOL 235 (H) 08/12/2016   TRIG 187 (H) 08/12/2016   HDL 75 08/12/2016   LDLCALC 123 (H) 08/12/2016   ALT 19 08/13/2016   AST 28 08/13/2016   NA 137 08/13/2016  K 3.6 08/13/2016   CL 111 08/13/2016   CREATININE 0.67 08/13/2016   BUN 14 08/13/2016   CO2 18 (L) 08/13/2016   TSH 8.600 (H) 08/12/2016   INR 1.06 08/13/2016    Left Heart Cath and Coronary Angiography  Conclusion   Conclusions: 1. Severe single vessel coronary artery disease with hazy 80% ostial LAD stenosis. IVUS of this lesion demonstrates eccentric ruptured plaque. Minimal luminal area is 5.3 mm. 2. Mild, nonobstructive CAD involving the LCx and RCA. 3. Marked mid and apical anterior hypokinesis with otherwise preserved LV contraction. LVEF approximately 30-35%. 4. Mildly elevated left ventricular filling pressure. 5. Successful placement of intra-aortic balloon pump via the right common femoral artery.  Recommendations: 1. Given ostial LAD stenosis with ongoing chest pain, surgical consultation has been obtained to evaluate for LIMA to LAD. Ostial nature of the lesion with plaque extending back into the distal LMCA makes this a suboptimal PCI target. 2. Continue intra-aortic balloon pump at 1:1 with up titration of nitroglycerin for relief of chest pain. 3. Aggressive  secondary prevention. 4. Obtain transthoracic echocardiogram to reevaluate LV function and to assess for significant valvular abnormalities.  Nelva Bush, MD [facility] Pager: [phone]   Indications   Non-ST elevation (NSTEMI) myocardial infarction ([place]) [I21.4 (ICD-10-CM)]  Procedural Details/Technique   Technical Details Indication: 36 y.o. year-old woman with history substance abuse and anxiety, admitted with acute onset of chest pain around noon yesterday. Troponin was elevated with anterior T-wave inversions concerning for NSTEMI.  GFR: >60 ml/min  Procedure: The risks, benefits, complications, treatment options, and expected outcomes were discussed with the patient. The patient and/or family concurred with the proposed plan, giving informed consent. The patient was brought to the cath lab after IV hydration was begun and oral premedication was given. The patient was further sedated with Versed and Fentanyl. The right wrist was assessed with a modified Allens test which was normal. The right wrist was prepped and draped in a sterile fashion. 1% lidocaine was used for local anesthesia. Using the modified Seldinger access technique, a 3F slender Glidesheath was placed in the right radial artery. 3 mg Verapamil was given through the sheath. Heparin 4500 units were administered.  Selective coronary angiography was performed using 14F JL3.5 and JR4 catheters to engage the left and right coronary arteries, respectively. Left heart catheterization was performed using a 14F pigtail catheter. Left ventriculogram was performed with a power injection of contrast.  IVUS of LAD Heparin was used for anticoagulation. The left coronary artery was engaged with a 3F EBU 3.5 guide catheter. A cougar wire was advanced into the distal LAD. Intravascular ultrasound of the LAD and LMCA was performed with an Opti-Cross catheter, demonstrating ruptured plaque with significant stenosis in the ostial  LAD, as well as eccentric plaque in the distal LMCA.  IABP insertion The right groin was prepped in sterile fashion. Under 1% lidocaine local anesthesia, a 7.14F sheath was placed in the right common femoral using a 14F micropuncture kit. A 40 mL intraaortic balloon pump was advanced into the distal aorta with its tip just distal to the aortic arch. Appropriate augmentation was noted and the IABP was secured in place.  At the end of the procedure, the radial artery sheath was removed and a TR band applied to achieve patent hemostasis. There were no immediate complications. The patient was taken to the recovery area in stable condition.  Contrast used: 110 mL Isovue Fluoroscopy time: 7.5 min Radiation dose: 474 mGy  Estimated blood loss <50 mL.  During this procedure the patient was administered the following to achieve and maintain moderate conscious sedation: Versed 2 mg, Fentanyl 125 mcg, while the patient's heart rate, blood pressure, and oxygen saturation were continuously monitored. The period of conscious sedation was 76 minutes, of which I was present face-to-face 100% of this time.    Complications   Complications documented before study signed (08/13/2016 9:45 AM EDT)    No complications were associated with this study.  Documented by Nelva Bush, MD - 08/13/2016 9:21 AM EDT    Coronary Findings   Dominance: Right  Left Main  Vessel is large.  LM lesion, 40% stenosed. The lesion is eccentric. Distal LMCA  Left Anterior Descending  Ost LAD lesion, 80% stenosed. The lesion is eccentric. IVUS was performed on the lesion. Minimum stent area: 5.3 mm. There is moderate plaque burden detected. IVUS has determined that the lesion is eccentric and ulcerative. Lesion appears hazy.  First Diagonal Branch  Vessel is moderate in size.  Second Diagonal Branch  Vessel is small in size.  Left Circumflex  Vessel is large.  Mid Cx lesion, 30% stenosed.  First Obtuse Marginal Branch    Vessel is small in size.  Second Obtuse Marginal Branch  Vessel is moderate in size.  Third Obtuse Marginal Branch  Vessel is moderate in size.  Right Coronary Artery  Vessel is large.  Prox RCA lesion, 10% stenosed.  Mid RCA to Dist RCA lesion, 30% stenosed.  Right Posterior Descending Artery  Vessel is moderate in size.  Right Posterior Atrioventricular Branch  Vessel is moderate in size.  Impella/IABP   Hemodynamic Support An IABP was inserted for hemodynamic support in the setting of cardiogenic shock. Access site: right femoral artery.    Wall Motion              Left Heart   Left Ventricle The left ventricular size is normal. There is severe left ventricular systolic dysfunction. LVEF 30-35%. LV end diastolic pressure is mildly elevated. There are LV function abnormalities. There is no evidence of mitral regurgitation.    Coronary Diagrams   Diagnostic Diagram          Assessment / Plan:  Severe single vessel CAD s/p NSTEMI- plan urgent CABG     GOLD,WAYNE E, PA-C 08/13/2016 12:13 PM     I have seen and examined the patient and agree with the assessment and plan as outlined above.  Patient is a 36 year old female with no previous history of coronary artery disease but risk factors notable for history of tobacco abuse and a strong family history of premature coronary artery disease. She describes a 2 year history of intermittent episodes of exertional chest discomfort described as a "squeezing-like pain" in her mid chest. Yesterday she developed severe substernal chest pain that persisted, prompting her to present to the emergency department at [facility]. EKG revealed nonspecific changes but troponins were elevated and the patient has ruled in for an acute non-ST segment elevation myocardial infarction. Diagnostic cardiac catheterization demonstrates critical ostial stenosis of the left anterior descending coronary artery. There is otherwise only  mild nonobstructive coronary artery disease. There is mild left ventricular systolic dysfunction with hypokinesis of the anterior wall. I agree that coronary anatomy appears unfavorable for percutaneous coronary intervention in favor proceeding directly to the operating room for emergency surgical revascularization.  I have reviewed the indications, risks, and potential benefits of coronary artery bypass grafting with the patient and  her husband in the ICU.  Alternative treatment strategies have been discussed, including the relative risks, benefits and long term prognosis associated with medical therapy, percutaneous coronary intervention, and surgical revascularization.  The patient understands and accepts all potential associated risks of surgery including but not limited to risk of death, stroke or other neurologic complication, myocardial infarction, congestive heart failure, respiratory failure, renal failure, bleeding requiring blood transfusion and/or reexploration, aortic dissection or other major vascular complication, arrhythmia, heart block or bradycardia requiring permanent pacemaker, pneumonia, pleural effusion, wound infection, pulmonary embolus or other thromboembolic complication, chronic pain or other delayed complications related to median sternotomy, or the late recurrence of symptomatic ischemic heart disease and/or congestive heart failure.  The importance of long term risk modification have been emphasized.  All questions answered.   I spent in excess of 60 minutes during the conduct of this hospital encounter and >50% of this time involved direct face-to-face encounter with the patient for counseling and/or coordination of their care.  Rexene Alberts, MD 08/13/2016 2:28 PM

## 2016-08-13 NOTE — Care Management Note (Signed)
Case Management Note  Patient Details  Name: Catherine Goodman MRN: [id number] Date of Birth: 02/06/1980  Subjective/Objective:   From home, presents with chest pain with elevated trops, possible NSTEMI, psa (marijauna and cocaine), obesity, anxiety and depression, she is for cardiac cath today.                 Action/Plan: NCM will follow for dc needs.  Expected Discharge Date:                  Expected Discharge Plan:     In-House Referral:     Discharge planning Services  CM Consult  Post Acute Care Choice:    Choice offered to:     DME Arranged:    DME Agency:     HH Arranged:    HH Agency:     Status of Service:  In process, will continue to follow  If discussed at Long Length of Stay Meetings, dates discussed:    Additional Comments:  Leone Havenaylor, Zaidin Blyden Clinton, RN 08/13/2016, 10:28 AM

## 2016-08-13 NOTE — Interval H&P Note (Signed)
History and Physical Interval Note:  08/13/2016 7:25 AM  Catherine Goodman  has presented today for surgery, with the diagnosis of NSTEMI. The various methods of treatment have been discussed with the patient and family. After consideration of risks, benefits and other options for treatment, the patient has consented to  Procedure(s): Left Heart Cath and Coronary Angiography (N/A) as a surgical intervention .  The patient's history has been reviewed, patient examined, no change in status, stable for surgery.  I have reviewed the patient's chart and labs.  Questions were answered to the patient's satisfaction.    Cath Lab Visit (complete for each Cath Lab visit)  Clinical Evaluation Leading to the Procedure:   ACS: Yes.    Non-ACS: N/A  Andron Marrazzo

## 2016-08-13 NOTE — Anesthesia Procedure Notes (Signed)
Arterial Line Insertion Performed by: Dorie RankQUINN, HOLLY M, CRNA  Patient location: OR. Preanesthetic checklist: patient identified, IV checked, site marked, risks and benefits discussed, surgical consent, monitors and equipment checked, pre-op evaluation and timeout performed Lidocaine 1% used for infiltration Left, radial was placed Catheter size: 20 G Hand hygiene performed  and Seldinger technique used Allen's test indicative of satisfactory collateral circulation Attempts: 1 Procedure performed without using ultrasound guided technique. Following insertion, dressing applied and Biopatch. Post procedure assessment: normal  Patient tolerated the procedure well with no immediate complications.

## 2016-08-13 NOTE — Op Note (Signed)
CARDIOTHORACIC SURGERY OPERATIVE NOTE  Date of Procedure:  08/13/2016  Preoperative Diagnosis:   Severe Single-vessel Coronary Artery Disease  S/P Acute Non-ST Segment Elevation Myocardial Infarction  Unstable Post-infarction Angina  Postoperative Diagnosis: Same  Procedure:    Emergency Off-pump Coronary Artery Bypass Grafting x 1   Left Internal Mammary Artery to Distal Left Anterior Descending Coronary Artery  Surgeon: Salvatore Decentlarence H. Cornelius Moraswen, MD  Assistant: Rowe ClackWayne E. Gold, PA-C  Anesthesia: Sharee Holstererry Massagee, MD  Operative Findings:  Normal left ventricular systolic function  Good quality left internal mammary artery conduit  Good quality target vessel for grafting    BRIEF CLINICAL NOTE AND INDICATIONS FOR SURGERY  Patient is a 36 year old female with no previous history of coronary artery disease but risk factors notable for history of tobacco abuse and a strong family history of premature coronary artery disease. She describes a 2 year history of intermittent episodes of exertional chest discomfort described as a "squeezing-like pain" in her mid chest. Yesterday she developed severe substernal chest pain that persisted, prompting her to [facility] emergency [facility]. EKG revealed nonspecific changes but troponins were elevated and the patient has ruled in for an acute non-ST segment elevation myocardial infarction. Diagnostic cardiac catheterization demonstrates critical ostial stenosis of the left anterior descending coronary artery. There is otherwise only mild nonobstructive coronary artery disease.  The patient had continued chest pain in the cath lab, requiring placement of intraaortic balloon pump for stabilization.  Emergency cardiothoracic surgical consultation was requested.  The patient has been seen in consultation and counseled at length regarding the indications, risks and potential benefits of surgery.  All questions have been  answered, and the patient provides full informed consent for the operation as described.   DETAILS OF THE OPERATIVE PROCEDURE  Preparation:  The patient is brought to the operating room on the above mentioned date and central monitoring was established by the anesthesia team including placement of Swan-Ganz catheter and radial arterial line. The patient is placed in the supine position on the operating table.  Intravenous antibiotics are administered. General endotracheal anesthesia is induced uneventfully. A Foley catheter is placed.  Baseline transesophageal echocardiogram was performed.  Findings were notable for low normal LV systolic function.  The patient's chest, abdomen, both groins, and both lower extremities are prepared and draped in a sterile manner. A time out procedure is performed.   Surgical Approach and Harvest of Conduit:  A median sternotomy incision was performed and the left internal mammary artery is dissected from the chest wall and prepared for bypass grafting. The left internal mammary artery is notably good quality conduit. Following systemic heparinization, the left internal mammary artery was transected distally noted to have excellent flow.  The pericardium is opened. The ascending aorta is normal in appearance.  The [employer] off-pump cardiac stabilization system is utilized to facilitate off-pump coronary revascularization.  Both the apical suction cup and the U-shaped stabilization arm are utilized.     Off-pump Coronary Artery Bypass Grafting:  The distal left anterior coronary artery was grafted with the left internal mammary artery in an end-to-side fashion.  At the site of distal anastomosis the target vessel was good quality and measured approximately 2.0 mm in diameter.  Elastic vessel loops are used for proximal and distal hemostatic control.  Intracoronary shunts are not utilized.   Procedure Completion:  Followup transesophageal echocardiogram  performed after completion of all grafts revealed no changes from the preoperative exam.  Protamine was administered  to reverse the anticoagulation.   The mediastinum and pleural space were inspected for hemostasis and irrigated with saline solution. The mediastinum and the left pleural space were drained using 3 chest tubes placed through separate stab incisions inferiorly.  The soft tissues anterior to the aorta were reapproximated loosely. The sternum is closed with double strength sternal wire. The soft tissues anterior to the sternum were closed in multiple layers and the skin is closed with a running subcuticular skin closure.  No blood products were administered during the operation.   Patient Disposition:  The patient tolerated the procedure well and is transported to the surgical intensive care in stable condition. There are no intraoperative complications. All sponge instrument and needle counts are verified correct at completion of the operation.    Salvatore Decentlarence H. Cornelius Moraswen MD 08/13/2016 5:08 PM

## 2016-08-13 NOTE — Progress Notes (Signed)
  Echocardiogram Echocardiogram Transesophageal has been performed.  Delcie RochENNINGTON, Rosalita Carey 08/13/2016, 6:13 PM

## 2016-08-13 NOTE — Brief Op Note (Signed)
08/12/2016 - 08/13/2016  4:39 PM  PATIENT:  Catherine Goodman  36 y.o. female  PRE-OPERATIVE DIAGNOSIS:  CAD  POST-OPERATIVE DIAGNOSIS:  CAD  PROCEDURE:  Procedure(s): OFF PUMP CORONARY ARTERY BYPASS GRAFTING (CABG) X 1 (N/A) TRANSESOPHAGEAL ECHOCARDIOGRAM (TEE) (N/A) LIMA-LAD  SURGEON:  Surgeon(s) and Role:    * Purcell Nailswen, Clarence H, MD - Primary  PHYSICIAN ASSISTANT: Angelica Wix PA-C  ANESTHESIA:   general  EBL:  Total I/O In: -  Out: 295 [Urine:295]  BLOOD ADMINISTERED:none  DRAINS: 3 Chest Tube(s) in the MEDIASTINUM ,LEFT CHEST   LOCAL MEDICATIONS USED:  NONE  SPECIMEN:  No Specimen  DISPOSITION OF SPECIMEN:  N/A  COUNTS:  YES  TOURNIQUET:  * No tourniquets in log *  DICTATION: .Dragon Dictation  PLAN OF CARE: Admit to inpatient   PATIENT DISPOSITION:  ICU - intubated and hemodynamically stable.   Delay start of Pharmacological VTE agent (>24hrs) due to surgical blood loss or risk of bleeding: yes  COMPLICATIONS: NO KNOWN

## 2016-08-13 NOTE — Progress Notes (Signed)
ANTICOAGULATION CONSULT NOTE - Follow Up Consult  Pharmacy Consult for heparin Indication: chest pain/ACS  Labs:  Recent Labs  08/12/16 2251 08/13/16 0042 08/13/16 0412  HGB  --  12.8  --   HCT  --  38.3  --   PLT  --  327  --   HEPARINUNFRC  --  <0.10*  --   CREATININE  --  0.80  --   TROPONINI 1.71*  --  1.36*    Assessment: [age]yo female now s/p cath with IABP placed and plans for TCTS consult. Previously subtherapeutic on 1000 units/hr and increased before cath lab to 1400 units/hr - will resume with no bolus at slightly reduced rate as patient received heparin bolus in cath lab.   Goal of Therapy:  Heparin Level 0.2-0.5 mg/dl   Plan:  -Resume heparin at 1300 units/hr (no bolus) -Check 6-hr heparin level -Monitor daily heparin level, CBC, S/Sx bleeding  Fredonia HighlandMichael Keoki Mchargue, PharmD PGY-2 Cardiology Pharmacy Resident Pager: [phone] 08/13/2016

## 2016-08-13 NOTE — Anesthesia Preprocedure Evaluation (Signed)
Anesthesia Evaluation  Patient identified by MRN, date of birth, ID band Patient awake  Preop documentation limited or incomplete due to emergent nature of procedure.  Airway Mallampati: II   Neck ROM: Full    Dental  (+) Poor Dentition, Chipped   Pulmonary Current Smoker,    breath sounds clear to auscultation       Cardiovascular + CAD and + Past MI   Rhythm:Regular Rate:Tachycardia     Neuro/Psych    GI/Hepatic (+)     substance abuse  ,   Endo/Other    Renal/GU      Musculoskeletal   Abdominal   Peds  Hematology   Anesthesia Other Findings   Reproductive/Obstetrics                             Anesthesia Physical Anesthesia Plan  ASA: IV and emergent  Anesthesia Plan: General   Post-op Pain Management:    Induction: Intravenous  PONV Risk Score and Plan: 3 and Ondansetron, Dexamethasone, Propofol and Midazolam  Airway Management Planned: Oral ETT  Additional Equipment: Arterial line, PA Cath, TEE and Ultrasound Guidance Line Placement  Intra-op Plan:   Post-operative Plan: Post-operative intubation/ventilation  Informed Consent: I have reviewed the patients History and Physical, chart, labs and discussed the procedure including the risks, benefits and alternatives for the proposed anesthesia with the patient or authorized representative who has indicated his/her understanding and acceptance.   Dental advisory given  Plan Discussed with: CRNA  Anesthesia Plan Comments:         Anesthesia Quick Evaluation

## 2016-08-13 NOTE — Progress Notes (Signed)
ANTICOAGULATION CONSULT NOTE - Follow Up Consult  Pharmacy Consult for heparin Indication: chest pain/ACS  Labs:  Recent Labs  08/12/16 2251 08/13/16 0042  HGB  --  12.8  HCT  --  38.3  PLT  --  327  HEPARINUNFRC  --  <0.10*  CREATININE  --  0.80  TROPONINI 1.71*  --     Assessment: 36yo female undetectable on heparin with initial dosing for CP.  Goal of Therapy:  Heparin level 0.3-0.7 units/ml   Plan:  Will rebolus with heparin 3000 units and increase gtt by 4 units/kg/hr to 1400 units/hr and check level in 6hr.  Catherine Goodman, PharmD, BCPS  08/13/2016,1:36 AM

## 2016-08-13 NOTE — Progress Notes (Signed)
  Echocardiogram 2D Echocardiogram has been performed.  Delcie RochENNINGTON, Stock[place] Nunley 08/13/2016, 11:13 AM

## 2016-08-14 ENCOUNTER — Encounter (HOSPITAL_COMMUNITY): Payer: Self-pay | Admitting: Thoracic Surgery (Cardiothoracic Vascular Surgery)

## 2016-08-14 ENCOUNTER — Inpatient Hospital Stay (HOSPITAL_COMMUNITY): Payer: Self-pay

## 2016-08-14 LAB — POCT I-STAT 3, ART BLOOD GAS (G3+)
Acid-base deficit: 5 mmol/L — ABNORMAL HIGH (ref 0.0–2.0)
Acid-base deficit: 6 mmol/L — ABNORMAL HIGH (ref 0.0–2.0)
Acid-base deficit: 7 mmol/L — ABNORMAL HIGH (ref 0.0–2.0)
BICARBONATE: 19.8 mmol/L — AB (ref 20.0–28.0)
Bicarbonate: 20.1 mmol/L (ref 20.0–28.0)
Bicarbonate: 19.3 mmol/L — ABNORMAL LOW (ref 20.0–28.0)
O2 SAT: 96 %
O2 SAT: 93 %
O2 Saturation: 99 %
PCO2 ART: 34.6 mmHg (ref 32.0–48.0)
PH ART: 7.356 (ref 7.350–7.450)
PO2 ART: 69 mmHg — AB (ref 83.0–108.0)
Patient temperature: 37.1
TCO2: 20 mmol/L (ref 0–100)
TCO2: 21 mmol/L (ref 0–100)
TCO2: 21 mmol/L (ref 0–100)
pCO2 arterial: 38.3 mmHg (ref 32.0–48.0)
pCO2 arterial: 40.8 mmHg (ref 32.0–48.0)
pH, Arterial: 7.292 — ABNORMAL LOW (ref 7.350–7.450)
pH, Arterial: 7.32 — ABNORMAL LOW (ref 7.350–7.450)
pO2, Arterial: 135 mmHg — ABNORMAL HIGH (ref 83.0–108.0)
pO2, Arterial: 81 mmHg — ABNORMAL LOW (ref 83.0–108.0)

## 2016-08-14 LAB — CBC
HCT: 35.6 % — ABNORMAL LOW (ref 36.0–46.0)
HEMATOCRIT: 37.8 % (ref 36.0–46.0)
HEMOGLOBIN: 12.2 g/dL (ref 12.0–15.0)
Hemoglobin: 11.3 g/dL — ABNORMAL LOW (ref 12.0–15.0)
MCH: 28.8 pg (ref 26.0–34.0)
MCH: 29.8 pg (ref 26.0–34.0)
MCHC: 31.7 g/dL (ref 30.0–36.0)
MCHC: 32.3 g/dL (ref 30.0–36.0)
MCV: 90.6 fL (ref 78.0–100.0)
MCV: 92.4 fL (ref 78.0–100.0)
PLATELETS: 204 10*3/uL (ref 150–400)
Platelets: 264 10*3/uL (ref 150–400)
RBC: 3.93 MIL/uL (ref 3.87–5.11)
RBC: 4.09 MIL/uL (ref 3.87–5.11)
RDW: 15.2 % (ref 11.5–15.5)
RDW: 15.3 % (ref 11.5–15.5)
WBC: 9.1 10*3/uL (ref 4.0–10.5)
WBC: 13.5 10*3/uL — ABNORMAL HIGH (ref 4.0–10.5)

## 2016-08-14 LAB — BASIC METABOLIC PANEL
Anion gap: 7 (ref 5–15)
BUN: 9 mg/dL (ref 6–20)
CO2: 20 mmol/L — ABNORMAL LOW (ref 22–32)
CREATININE: 0.67 mg/dL (ref 0.44–1.00)
Calcium: 8 mg/dL — ABNORMAL LOW (ref 8.9–10.3)
Chloride: 110 mmol/L (ref 101–111)
GFR calc Af Amer: >60 mL/min (ref >60)
GLUCOSE: 114 mg/dL — AB (ref 65–99)
Potassium: 4 mmol/L (ref 3.5–5.1)
SODIUM: 137 mmol/L (ref 135–145)

## 2016-08-14 LAB — ECHO INTRAOPERATIVE TEE
HEIGHTINCHES: 66 in
WEIGHTICAEL: 3280.44 [oz_av]

## 2016-08-14 LAB — POCT I-STAT, CHEM 8
BUN: 3 mg/dL — AB (ref 6–20)
CALCIUM ION: 1.21 mmol/L (ref 1.15–1.40)
CHLORIDE: 104 mmol/L (ref 101–111)
Creatinine, Ser: 0.5 mg/dL (ref 0.44–1.00)
Glucose, Bld: 131 mg/dL — ABNORMAL HIGH (ref 65–99)
HCT: 40 % (ref 36.0–46.0)
Hemoglobin: 13.6 g/dL (ref 12.0–15.0)
Potassium: 3.9 mmol/L (ref 3.5–5.1)
SODIUM: 140 mmol/L (ref 135–145)
TCO2: 22 mmol/L (ref 0–100)

## 2016-08-14 LAB — GLUCOSE, CAPILLARY
GLUCOSE-CAPILLARY: 142 mg/dL — AB (ref 65–99)
Glucose-Capillary: 102 mg/dL — ABNORMAL HIGH (ref 65–99)
Glucose-Capillary: 126 mg/dL — ABNORMAL HIGH (ref 65–99)
Glucose-Capillary: 139 mg/dL — ABNORMAL HIGH (ref 65–99)
Glucose-Capillary: 94 mg/dL (ref 65–99)
Glucose-Capillary: 97 mg/dL (ref 65–99)

## 2016-08-14 LAB — CREATININE, SERUM
Creatinine, Ser: 0.61 mg/dL (ref 0.44–1.00)
GFR calc Af Amer: >60 mL/min (ref >60)

## 2016-08-14 LAB — POCT I-STAT 4, (NA,K, GLUC, HGB,HCT)
GLUCOSE: 102 mg/dL — AB (ref 65–99)
HEMATOCRIT: 34 % — AB (ref 36.0–46.0)
HEMOGLOBIN: 11.6 g/dL — AB (ref 12.0–15.0)
POTASSIUM: 4.4 mmol/L (ref 3.5–5.1)
SODIUM: 141 mmol/L (ref 135–145)

## 2016-08-14 LAB — POCT ACTIVATED CLOTTING TIME
ACTIVATED CLOTTING TIME: 125 s
Activated Clotting Time: 241 seconds

## 2016-08-14 LAB — MAGNESIUM
MAGNESIUM: 2.4 mg/dL (ref 1.7–2.4)
MAGNESIUM: 2 mg/dL (ref 1.7–2.4)

## 2016-08-14 MED ORDER — SODIUM CHLORIDE 0.9 % IV SOLN
INTRAVENOUS | Status: DC
  Administered 2016-08-14 – 2016-08-15 (×2): via INTRAVENOUS

## 2016-08-14 MED ORDER — ORAL CARE MOUTH RINSE
15.0000 mL | Freq: Two times a day (BID) | OROMUCOSAL | Status: DC
  Administered 2016-08-14 – 2016-08-15 (×3): 15 mL via OROMUCOSAL

## 2016-08-14 MED FILL — Electrolyte-R (PH 7.4) Solution: INTRAVENOUS | Qty: 3000 | Status: AC

## 2016-08-14 MED FILL — Heparin Sodium (Porcine) Inj 1000 Unit/ML: INTRAMUSCULAR | Qty: 10 | Status: AC

## 2016-08-14 MED FILL — Sodium Chloride IV Soln 0.9%: INTRAVENOUS | Qty: 2000 | Status: AC

## 2016-08-14 MED FILL — Lidocaine HCl IV Inj 20 MG/ML: INTRAVENOUS | Qty: 5 | Status: AC

## 2016-08-14 MED FILL — Mannitol IV Soln 20%: INTRAVENOUS | Qty: 500 | Status: AC

## 2016-08-14 MED FILL — Sodium Bicarbonate IV Soln 8.4%: INTRAVENOUS | Qty: 50 | Status: AC

## 2016-08-14 MED FILL — Nitroglycerin IV Soln 100 MCG/ML in D5W: INTRA_ARTERIAL | Qty: 10 | Status: AC

## 2016-08-14 NOTE — Progress Notes (Signed)
CT surgery p.m. Rounds  Patient examined and record reviewed.Hemodynamics stable,labs satisfactory.Patient had stable day. Patient pain well-controlled Continue current care. Kathlee Nationseter Van Trigt III 08/14/2016

## 2016-08-14 NOTE — Progress Notes (Signed)
Site area: RFA IABP catheter and sheath Site Prior to Removal:  Level 0 Pressure Applied For:30 min Manual:   yes Patient Status During Pull: stable  Post Pull Site:  Level 0 Post Pull Instructions Given:   Post Pull Pulses Present: palpable Dressing Applied:  tegaderm Bedrest begins @ 0845 Comments:

## 2016-08-14 NOTE — Discharge Summary (Signed)
Physician Discharge Summary  Patient ID: Catherine Goodman MRN: [id number] DOB/AGE: 38/04/1980 38 y.o.  Admit date: 08/12/2016 Discharge date: 08/18/2016  Admission Diagnoses: 1. NSTEMI 2. Multivessel CAD  Active Diagnoses:  1. Obesity (BMI 30-39.9) 2. Family history of cardiovascular disease 3. History of substance abuse 4. Tobacco abuse 5. ABL anemia  History of Present Illness:   The patient is a 36 year old female who presented to the emergency department last evening. She does have a known family history of cardiac disease with father having previous bypass surgery. She reports that she has been under significant stress recently. She admits to previous cocaine use in the past and yesterday token Adderall for energy. She developed midsternal chest pain that felt as though somebody was sitting on her chest. [facility] where EKG showed some T-wave abnormalities in V2 and V3 and additionally there was an abnormal troponin. She has ruled in for non-STEMI. She was transferred [facility] evaluation and treatment. She was placed on nitroglycerin and became pain-free. She underwent cardiac catheterization today and was found to have significant ostial LAD disease. An intra-aortic balloon pump has been placed at 1:1 and she has been referred to CT surgery for consideration of emergent CABG. For full details of the cath please see below. Cardiac risk factors include tobacco abuse, obesity and hypercholesterolemia. Her TSH is noted to be elevated but has no history of thyroid disease. Blood sugars are mildly elevated so may have some insulin resistance, no HgBA1C done.  Hospital Course: The patient was admitted with non-STEMI myocardial infarction and in the catheterization lab she was found to have severe single-vessel LAD disease. She was felt to require emergent surgery and an intra-aortic balloon pump was placed. She was seen in  consultation by Dr. Tressie Stalkerlarence Owen who evaluated the patient and her studies and agreed with proceeding with surgery. On 08/13/2016 she was taken to the operating room where she underwent the below described procedure. She tolerated well was taken to the surgical intensive care unit in stable condition.  Postoperative hospital course:  The patient has done well. She was weaned from the ventilator without difficulty using standard protocols. She has maintained stable hemodynamics. She has a mild expected acute blood loss anemia which is stable. On postoperative day #1 the intra-aortic balloon pump was removed. All routine lines, monitors and drainage devices have been discontinued in the standard fashion. She is maintaining sinus rhythm and stable hemodynamics. Incisions are healing well without evidence of infection. She is tolerating gradually increasing activities using standard protocols. She has some routine postoperative atelectasis which is responding well to incentive spirometry. She is ambulating on room air. She is tolerating a diet and has had a bowel movement. She continues with sternal, incisional pain although it is slowly improving. Her wound is clean and dry and has no sign of infection. She is Chest tube sutures will be removed in the office after discharge. She is felt surgically stable for discharge today.  Consults: cardiology  Significant Diagnostic Studies: angiography: CARDIAC CATH  Treatments: surgery:   CARDIOTHORACIC SURGERY OPERATIVE NOTE  Date of Procedure:                08/13/2016  Preoperative Diagnosis:        Severe Single-vessel Coronary Artery Disease  S/P Acute Non-ST Segment Elevation Myocardial Infarction  Unstable Post-infarction Angina  Postoperative Diagnosis:    Same  Procedure:  Emergency Off-pump Coronary Artery Bypass Grafting x 1              Left Internal Mammary Artery to Distal Left Anterior Descending Coronary Artery  Surgeon:         Salvatore Decent. Cornelius Moras, MD  Assistant:       Rowe Clack, PA-C  Anesthesia:    Sharee Holster, MD  Operative Findings: ? Normal left ventricular systolic function ? Good quality left internal mammary artery conduit ? Good quality target vessel for grafting ?  Discharge Exam: Blood pressure 136/87, pulse 91, temperature 98.3 F (36.8 C), temperature source Oral, resp. rate (!) 23, height 5\' 6"  (1.676 m), weight 88.3 kg (194 lb 11.2 oz), last menstrual period 08/17/2016, SpO2 95 %.  Cardiovascular: RRR Pulmonary: Slightly diminished at bases Abdomen: Soft, non tender, bowel sounds present. Extremities: Trace bilateral lower extremity edema. Wound: Sternal dressing removed and wound is clean and dry.  No erythema or signs of infection.  Disposition: Stable and discharged to home.   Allergies as of 08/18/2016   No Known Allergies     Medication List    STOP taking these medications   ibuprofen 600 MG tablet Commonly known as:  ADVIL,MOTRIN   traMADol 50 MG tablet Commonly known as:  ULTRAM     TAKE these medications   aspirin 81 MG EC tablet Take 1 tablet (81 mg total) by mouth daily.   atorvastatin 40 MG tablet Commonly known as:  LIPITOR Take 1 tablet (40 mg total) by mouth daily at 6 PM.   busPIRone 10 MG tablet Commonly known as:  BUSPAR Take 10 mg by mouth daily.   clopidogrel 75 MG tablet Commonly known as:  PLAVIX Take 1 tablet (75 mg total) by mouth daily.   lisinopril 10 MG tablet Commonly known as:  PRINIVIL,ZESTRIL Take 1 tablet (10 mg total) by mouth daily.   metoprolol tartrate 25 MG tablet Commonly known as:  LOPRESSOR Take 1 tablet (25 mg total) by mouth 2 (two) times daily.   ondansetron 4 MG tablet Commonly known as:  ZOFRAN Take 1 tablet (4 mg total) by mouth every 8 (eight) hours as needed for nausea or vomiting.   oxyCODONE 5 MG immediate release tablet Commonly known as:  Oxy IR/ROXICODONE Take 5 mg by mouth every 4-6 hours PRN  severe pain.      The patient has been discharged on:   1.Beta Blocker:  Yes [ x  ]                              No   [   ]                              If No, reason:  2.Ace Inhibitor/ARB: Yes [ x  ]                                     No  [    ]                                     If No, reason:  3.Statin:   Yes [ x  ]  No  [   ]                  If No, reason:  4.Ecasa:  Yes  [ x ]                  No   [   ]                  If No, reason:  Follow-up Information    Purcell Nailswen, Clarence H, MD Follow up on 09/07/2016.   Specialty:  Cardiothoracic Surgery Why:  PA/LAT CXR to be taken ([facility] which is in the same building as Dr. Orvan Julywen's office) on 09/07/2016 at 9:00 am;Appointment time is at 9:30 am Contact information: [address] [place] [address] [phone]        Laqueta LindenKoneswaran, Suresh A, MD. Go on 08/31/2016.   Specialty:  Cardiology Why:  @ 3:40pm. Please arrive at least 10 minutes early Contact information: [address] [place] [address] [phone]        Nurse Follow up on 08/25/2016.   Why:  Appointment time is at 10:00 am Contact information: [address] [place] [address]          Signed: Ardelle BallsZIMMERMAN,Abdikadir Fohl M PA-C 08/18/2016, 7:40 AM

## 2016-08-14 NOTE — Progress Notes (Signed)
Dr. Zenaida NieceVan Trigt rounded on pt about 2100.  Said to wait until he rounded in am before pulling chest tubes.

## 2016-08-14 NOTE — Progress Notes (Signed)
EKG CRITICAL VALUE     12 lead EKG performed.  Critical value noted.  Gwenith SpitzAvery Citty, RN notified.   Amin Fornwalt H, CCT 08/14/2016 7:02 AM

## 2016-08-14 NOTE — Progress Notes (Signed)
      [address]       [place] [id number]             [phone]        CARDIOTHORACIC SURGERY PROGRESS NOTE   R1 Day Post-Op Procedure(s) (LRB): OFF PUMP CORONARY ARTERY BYPASS GRAFTING (CABG) X 1.  LIMA TO LAD (N/A) TRANSESOPHAGEAL ECHOCARDIOGRAM (TEE) (N/A)  Subjective: Looks good.  Mild soreness in chest.  No SOB  Objective: Vital signs: BP Readings from Last 1 Encounters:  08/14/16 98/68   Pulse Readings from Last 1 Encounters:  08/14/16 88   Resp Readings from Last 1 Encounters:  08/14/16 (!) 22   Temp Readings from Last 1 Encounters:  08/14/16 99.3 F (37.4 C)    Hemodynamics: PAP: (13-33)/(5-22) 28/16 CO:  [4.4 L/min-6.2 L/min] 6.2 L/min CI:  [2.2 L/min/m2-3.1 L/min/m2] 3.1 L/min/m2  Physical Exam:  Rhythm:   sinus  Breath sounds: clear  Heart sounds:  RRR  Incisions:  Dressing dry, intact  Abdomen:  Soft, non-distended, non-tender  Extremities:  Warm, well-perfused  Chest tubes:  low volume thin serosanguinous output, no air leak    Intake/Output from previous day: 07/26 0701 - 07/27 0700 In: 3980.5 [I.V.:3340.5; NG/GT:40; IV Piggyback:600] Out: 2659 [Urine:2225; Chest Tube:434] Intake/Output this shift: No intake/output data recorded.  Lab Results:  CBC: Recent Labs  08/13/16 1802 08/13/16 1806 08/14/16 0242  WBC 9.3  --  9.1  HGB 11.1* 11.6* 11.3*  HCT 34.0* 34.0* 35.6*  PLT 225  --  204    BMET:  Recent Labs  08/13/16 1021  08/13/16 1645 08/13/16 1806 08/14/16 0242  NA 137  < > 139 141 137  K 3.6  < > 4.2 4.4 4.0  CL 111  < > 110  --  110  CO2 18*  --   --   --  20*  GLUCOSE 107*  < > 119* 102* 114*  BUN 14  < > 14  --  9  CREATININE 0.67  < > 0.60  --  0.67  CALCIUM 8.7*  --   --   --  8.0*  < > = values in this interval not displayed.   PT/INR:   Recent Labs  08/13/16 1802  LABPROT 14.7  INR 1.14    CBG (last 3)   Recent Labs  08/13/16 2029 08/13/16 2104 08/14/16 0005  GLUCAP 104*  107* 102*    ABG    Component Value Date/Time   PHART 7.356 08/14/2016 0124   PCO2ART 34.6 08/14/2016 0124   PO2ART 69.0 (L) 08/14/2016 0124   HCO3 19.3 (L) 08/14/2016 0124   TCO2 20 08/14/2016 0124   ACIDBASEDEF 5.0 (H) 08/14/2016 0124   O2SAT 93.0 08/14/2016 0124    CXR: Mild LLL opacity c/w atelectasis  EKG: NSR w/out acute ischemic changes    Assessment/Plan: S/P Procedure(s) (LRB): OFF PUMP CORONARY ARTERY BYPASS GRAFTING (CABG) X 1.  LIMA TO LAD (N/A) TRANSESOPHAGEAL ECHOCARDIOGRAM (TEE) (N/A)  Doing well POD1 Maintaining NSR w/ stable hemodynamics Breathing comfortably w/ O2 sats 99% on 4 L/min Expected post op acute blood loss anemia, mild Expected post op atelectasis, mild S/P acute non-STEMI Substance abuse   D/C IABP  Mobilize after IABP out D/C tubes later today or tomorrow, depending on output  DAPT, beta blocker, statin   Purcell Nailslarence H Jaymison Luber, MD 08/14/2016 7:38 AM

## 2016-08-14 NOTE — Anesthesia Postprocedure Evaluation (Signed)
Anesthesia Post Note  Patient: Catherine Goodman  Procedure(s) Performed: Procedure(s) (LRB): OFF PUMP CORONARY ARTERY BYPASS GRAFTING (CABG) X 1.  LIMA TO LAD (N/A) TRANSESOPHAGEAL ECHOCARDIOGRAM (TEE) (N/A)     Patient location during evaluation: SICU Anesthesia Type: General Level of consciousness: sedated and patient remains intubated per anesthesia plan Pain management: pain level controlled Vital Signs Assessment: post-procedure vital signs reviewed and stable Respiratory status: patient remains intubated per anesthesia plan Cardiovascular status: stable Anesthetic complications: no    Last Vitals:  Vitals:   08/14/16 0900 08/14/16 1000  BP: 109/78 106/79  Pulse:    Resp: 19 15  Temp: 37.1 C     Last Pain:  Vitals:   08/14/16 0942  TempSrc:   PainSc: 8                  Ziona Wickens,JAMES TERRILL

## 2016-08-14 NOTE — Discharge Instructions (Signed)
Coronary Artery Bypass Grafting, Care After °These instructions give you information on caring for yourself after your procedure. Your doctor may also give you more specific instructions. Call your doctor if you have any problems or questions after your procedure. °Follow these instructions at home: °· Only take medicine as told by your doctor. Take medicines exactly as told. Do not stop taking medicines or start any new medicines without talking to your doctor first. °· Take your pulse as told by your doctor. °· Do deep breathing as told by your doctor. Use your breathing device (incentive spirometer), if given, to practice deep breathing several times a day. Support your chest with a pillow or your arms when you take deep breaths or cough. °· Keep the area clean, dry, and protected where the surgery cuts (incisions) were made. Remove bandages (dressings) only as told by your doctor. If strips were applied to surgical area, do not take them off. They fall off on their own. °· Check the surgery area daily for puffiness (swelling), redness, or leaking fluid. °· If surgery cuts were made in your legs: °? Avoid crossing your legs. °? Avoid sitting for long periods of time. Change positions every 30 minutes. °? Raise your legs when you are sitting. Place them on pillows. °· Wear stockings that help keep blood clots from forming in your legs (compression stockings). °· Only take sponge baths until your doctor says it is okay to take showers. Pat the surgery area dry. Do not rub the surgery area with a washcloth or towel. Do not bathe, swim, or use a hot tub until your doctor says it is okay. °· Eat foods that are high in fiber. These include raw fruits and vegetables, whole grains, beans, and nuts. Choose lean meats. Avoid canned, processed, and fried foods. °· Drink enough fluids to keep your pee (urine) clear or pale yellow. °· Weigh yourself every day. °· Rest and limit activity as told by your doctor. You may be told  to: °? Stop any activity if you have chest pain, shortness of breath, changes in heartbeat, or dizziness. Get help right away if this happens. °? Move around often for short amounts of time or take short walks as told by your doctor. Gradually become more active. You may need help to strengthen your muscles and build endurance. °? Avoid lifting, pushing, or pulling anything heavier than 10 pounds (4.5 kg) for at least 6 weeks after surgery. °· Do not drive until your doctor says it is okay. °· Ask your doctor when you can go back to work. °· Ask your doctor when you can begin sexual activity again. °· Follow up with your doctor as told. °Contact a doctor if: °· You have puffiness, redness, more pain, or fluid draining from the incision site. °· You have a fever. °· You have puffiness in your ankles or legs. °· You have pain in your legs. °· You gain 2 or more pounds (0.9 kg) a day. °· You feel sick to your stomach (nauseous) or throw up (vomit). °· You have watery poop (diarrhea). °Get help right away if: °· You have chest pain that goes to your jaw or arms. °· You have shortness of breath. °· You have a fast or irregular heartbeat. °· You notice a "clicking" in your breastbone when you move. °· You have numbness or weakness in your arms or legs. °· You feel dizzy or light-headed. °This information is not intended to replace advice given to you by   your health care provider. Make sure you discuss any questions you have with your health care provider. °Document Released: 01/10/2013 Document Revised: 06/13/2015 Document Reviewed: 06/14/2012 °Elsevier Interactive Patient Education © 2017 [employer] ° °

## 2016-08-14 NOTE — Procedures (Signed)
Extubation Procedure Note  Patient Details:   Name: Manya V Korinek DOBDionisio Paschal: 11/27/1980 MRN: [id number]   Airway Documentation:     Evaluation  O2 sats: stable throughout Complications: No apparent complications Patient did tolerate procedure well. Bilateral Breath Sounds: Clear, Diminished   Yes    NIF -40  VC 1.7L  IS 500    .  Patient extubated to 4L [place].  No complications at this time.     Berton BonBlanca E Limon Nunez 08/14/2016, 12:17 AM

## 2016-08-15 ENCOUNTER — Inpatient Hospital Stay (HOSPITAL_COMMUNITY): Payer: Self-pay

## 2016-08-15 DIAGNOSIS — Z951 Presence of aortocoronary bypass graft: Secondary | ICD-10-CM

## 2016-08-15 LAB — POCT I-STAT, CHEM 8
BUN: 6 mg/dL (ref 6–20)
CHLORIDE: 101 mmol/L (ref 101–111)
Calcium, Ion: 1.28 mmol/L (ref 1.15–1.40)
Creatinine, Ser: 0.8 mg/dL (ref 0.44–1.00)
GLUCOSE: 135 mg/dL — AB (ref 65–99)
HEMATOCRIT: 36 % (ref 36.0–46.0)
HEMOGLOBIN: 12.2 g/dL (ref 12.0–15.0)
POTASSIUM: 3.9 mmol/L (ref 3.5–5.1)
Sodium: 138 mmol/L (ref 135–145)
TCO2: 25 mmol/L (ref 0–100)

## 2016-08-15 LAB — CBC
HEMATOCRIT: 37 % (ref 36.0–46.0)
HEMOGLOBIN: 11.7 g/dL — AB (ref 12.0–15.0)
MCH: 29.2 pg (ref 26.0–34.0)
MCHC: 31.6 g/dL (ref 30.0–36.0)
MCV: 92.3 fL (ref 78.0–100.0)
PLATELETS: 262 10*3/uL (ref 150–400)
RBC: 4.01 MIL/uL (ref 3.87–5.11)
RDW: 15.4 % (ref 11.5–15.5)
WBC: 11.6 10*3/uL — AB (ref 4.0–10.5)

## 2016-08-15 LAB — GLUCOSE, CAPILLARY
GLUCOSE-CAPILLARY: 140 mg/dL — AB (ref 65–99)
GLUCOSE-CAPILLARY: 127 mg/dL — AB (ref 65–99)
GLUCOSE-CAPILLARY: 119 mg/dL — AB (ref 65–99)
Glucose-Capillary: 127 mg/dL — ABNORMAL HIGH (ref 65–99)

## 2016-08-15 LAB — BASIC METABOLIC PANEL
ANION GAP: 6 (ref 5–15)
BUN: <5 mg/dL — ABNORMAL LOW (ref 6–20)
CALCIUM: 8.4 mg/dL — AB (ref 8.9–10.3)
CO2: 23 mmol/L (ref 22–32)
Chloride: 107 mmol/L (ref 101–111)
Creatinine, Ser: 0.6 mg/dL (ref 0.44–1.00)
GLUCOSE: 110 mg/dL — AB (ref 65–99)
POTASSIUM: 3.5 mmol/L (ref 3.5–5.1)
SODIUM: 136 mmol/L (ref 135–145)

## 2016-08-15 MED ORDER — FUROSEMIDE 10 MG/ML IJ SOLN
40.0000 mg | Freq: Every day | INTRAMUSCULAR | Status: DC
  Administered 2016-08-15 – 2016-08-16 (×2): 40 mg via INTRAVENOUS
  Filled 2016-08-15 (×2): qty 4

## 2016-08-15 MED ORDER — POTASSIUM CHLORIDE CRYS ER 20 MEQ PO TBCR
20.0000 meq | EXTENDED_RELEASE_TABLET | Freq: Two times a day (BID) | ORAL | Status: DC
  Administered 2016-08-15: 20 meq via ORAL
  Filled 2016-08-15: qty 1

## 2016-08-15 MED ORDER — POTASSIUM CHLORIDE 10 MEQ/50ML IV SOLN
10.0000 meq | INTRAVENOUS | Status: AC
  Administered 2016-08-15 (×3): 10 meq via INTRAVENOUS
  Filled 2016-08-15 (×3): qty 50

## 2016-08-15 MED ORDER — METOCLOPRAMIDE HCL 5 MG/ML IJ SOLN
10.0000 mg | Freq: Four times a day (QID) | INTRAMUSCULAR | Status: DC
  Administered 2016-08-15 – 2016-08-17 (×8): 10 mg via INTRAVENOUS
  Filled 2016-08-15 (×8): qty 2

## 2016-08-15 MED ORDER — LISINOPRIL 5 MG PO TABS
5.0000 mg | ORAL_TABLET | Freq: Every day | ORAL | Status: DC
  Administered 2016-08-15 – 2016-08-16 (×2): 5 mg via ORAL
  Filled 2016-08-15 (×2): qty 1

## 2016-08-15 NOTE — Progress Notes (Addendum)
CT surgery p.m. Rounds  Surgical pain better after removal of tubes Patient walking well in hallway Maintaining sinus rhythm Continue current care

## 2016-08-15 NOTE — Progress Notes (Addendum)
2 Days Post-Op Procedure(s) (LRB): OFF PUMP CORONARY ARTERY BYPASS GRAFTING (CABG) X 1.  LIMA TO LAD (N/A) TRANSESOPHAGEAL ECHOCARDIOGRAM (TEE) (N/A) Subjective: emerg CABG x1 nonstemi  Objective: Vital signs in last 24 hours: Temp:  [98.1 F (36.7 C)-99.2 F (37.3 C)] 98.4 F (36.9 C) (07/28 0400) Pulse Rate:  [97-109] 100 (07/28 0400) Cardiac Rhythm: Sinus tachycardia (07/28 0800) Resp:  [15-36] 27 (07/28 0900) BP: (83-153)/(59-106) 95/68 (07/28 0900) SpO2:  [92 %-98 %] 98 % (07/28 0900) Weight:  [201 lb 11.5 oz (91.5 kg)] 201 lb 11.5 oz (91.5 kg) (07/28 0700)  Hemodynamic parameters for last 24 hours:    Intake/Output from previous day: 07/27 0701 - 07/28 0700 In: 1866.3 [P.O.:1200; I.V.:466.3; IV Piggyback:200] Out: 2570 [Urine:2050; Chest Tube:520] Intake/Output this shift: Total I/O In: 90 [I.V.:40; IV Piggyback:50] Out: 30 [Chest Tube:30]       Exam    General- alert and comfortable   Lungs- clear without rales, wheezes   Cor- regular rate and rhythm, no murmur , gallop   Abdomen- soft, non-tender   Extremities - warm, non-tender, minimal edema   Neuro- oriented, appropriate, no focal weakness   Lab Results:  Recent Labs  08/14/16 1601 08/14/16 1607 08/15/16 0254  WBC 13.5*  --  11.6*  HGB 12.2 13.6 11.7*  HCT 37.8 40.0 37.0  PLT 264  --  262   BMET:  Recent Labs  08/14/16 0242  08/14/16 1607 08/15/16 0254  NA 137  --  140 136  K 4.0  --  3.9 3.5  CL 110  --  104 107  CO2 20*  --   --  23  GLUCOSE 114*  --  131* 110*  BUN 9  --  3* <5*  CREATININE 0.67  < > 0.50 0.60  CALCIUM 8.0*  --   --  8.4*  < > = values in this interval not displayed.  PT/INR:  Recent Labs  08/13/16 1802  LABPROT 14.7  INR 1.14   ABG    Component Value Date/Time   PHART 7.356 08/14/2016 0124   HCO3 19.3 (L) 08/14/2016 0124   TCO2 22 08/14/2016 1607   ACIDBASEDEF 5.0 (H) 08/14/2016 0124   O2SAT 93.0 08/14/2016 0124   CBG (last 3)   Recent Labs  08/14/16 2344 08/15/16 0403 08/15/16 0805  GLUCAP 142* 140* 127*    Assessment/Plan: S/P Procedure(s) (LRB): OFF PUMP CORONARY ARTERY BYPASS GRAFTING (CABG) X 1.  LIMA TO LAD (N/A) TRANSESOPHAGEAL ECHOCARDIOGRAM (TEE) (N/A) Mobilize Diuresis lisinopril fro preop MI   LOS: 3 days    Kathlee Nationseter Van Trigt III 08/15/2016

## 2016-08-16 ENCOUNTER — Inpatient Hospital Stay (HOSPITAL_COMMUNITY): Payer: Self-pay

## 2016-08-16 LAB — GLUCOSE, CAPILLARY
GLUCOSE-CAPILLARY: 82 mg/dL (ref 65–99)
GLUCOSE-CAPILLARY: 112 mg/dL — AB (ref 65–99)
Glucose-Capillary: 116 mg/dL — ABNORMAL HIGH (ref 65–99)
Glucose-Capillary: 117 mg/dL — ABNORMAL HIGH (ref 65–99)
Glucose-Capillary: 151 mg/dL — ABNORMAL HIGH (ref 65–99)
Glucose-Capillary: 86 mg/dL (ref 65–99)

## 2016-08-16 LAB — CBC
HCT: 33.3 % — ABNORMAL LOW (ref 36.0–46.0)
Hemoglobin: 10.6 g/dL — ABNORMAL LOW (ref 12.0–15.0)
MCH: 29.7 pg (ref 26.0–34.0)
MCHC: 31.8 g/dL (ref 30.0–36.0)
MCV: 93.3 fL (ref 78.0–100.0)
Platelets: 227 10*3/uL (ref 150–400)
RBC: 3.57 MIL/uL — ABNORMAL LOW (ref 3.87–5.11)
RDW: 15.4 % (ref 11.5–15.5)
WBC: 7.1 10*3/uL (ref 4.0–10.5)

## 2016-08-16 LAB — BASIC METABOLIC PANEL
Anion gap: 6 (ref 5–15)
BUN: 6 mg/dL (ref 6–20)
CO2: 26 mmol/L (ref 22–32)
Calcium: 8.7 mg/dL — ABNORMAL LOW (ref 8.9–10.3)
Chloride: 106 mmol/L (ref 101–111)
Creatinine, Ser: 0.65 mg/dL (ref 0.44–1.00)
GFR calc Af Amer: >60 mL/min (ref >60)
GFR calc non Af Amer: >60 mL/min (ref >60)
Glucose, Bld: 86 mg/dL (ref 65–99)
Potassium: 3.6 mmol/L (ref 3.5–5.1)
Sodium: 138 mmol/L (ref 135–145)

## 2016-08-16 MED ORDER — METOPROLOL TARTRATE 25 MG PO TABS
25.0000 mg | ORAL_TABLET | Freq: Two times a day (BID) | ORAL | Status: DC
  Administered 2016-08-16 – 2016-08-17 (×3): 25 mg via ORAL
  Filled 2016-08-16 (×3): qty 1

## 2016-08-16 MED ORDER — INSULIN ASPART 100 UNIT/ML ~~LOC~~ SOLN: 0.0000 [IU] | Freq: Three times a day (TID) | SUBCUTANEOUS | Status: DC

## 2016-08-16 MED ORDER — SODIUM CHLORIDE 0.9% FLUSH: 10.0000 mL | INTRAVENOUS | Status: DC | PRN

## 2016-08-16 MED ORDER — SODIUM CHLORIDE 0.9% FLUSH: 10.0000 mL | Freq: Two times a day (BID) | INTRAVENOUS | Status: DC

## 2016-08-16 MED ORDER — SODIUM CHLORIDE 0.9% FLUSH
3.0000 mL | Freq: Two times a day (BID) | INTRAVENOUS | Status: DC
  Administered 2016-08-16 – 2016-08-17 (×4): 3 mL via INTRAVENOUS

## 2016-08-16 MED ORDER — SODIUM CHLORIDE 0.9% FLUSH: 3.0000 mL | INTRAVENOUS | Status: DC | PRN

## 2016-08-16 MED ORDER — CHLORHEXIDINE GLUCONATE CLOTH 2 % EX PADS: 6.0000 | MEDICATED_PAD | Freq: Every day | CUTANEOUS | Status: DC

## 2016-08-16 MED ORDER — SODIUM CHLORIDE 0.9 % IV SOLN: 250.0000 mL | INTRAVENOUS | Status: DC | PRN

## 2016-08-16 MED ORDER — MAGNESIUM HYDROXIDE 400 MG/5ML PO SUSP: 30.0000 mL | Freq: Every day | ORAL | Status: DC | PRN

## 2016-08-16 MED ORDER — POTASSIUM CHLORIDE CRYS ER 20 MEQ PO TBCR
20.0000 meq | EXTENDED_RELEASE_TABLET | Freq: Two times a day (BID) | ORAL | Status: DC
  Administered 2016-08-16 (×2): 20 meq via ORAL
  Filled 2016-08-16 (×2): qty 1

## 2016-08-16 MED ORDER — MOVING RIGHT ALONG BOOK
Freq: Once | Status: AC
  Administered 2016-08-16: 09:00:00
  Filled 2016-08-16: qty 1

## 2016-08-16 NOTE — Progress Notes (Signed)
3 Days Post-Op Procedure(s) (LRB): OFF PUMP CORONARY ARTERY BYPASS GRAFTING (CABG) X 1.  LIMA TO LAD (N/A) TRANSESOPHAGEAL ECHOCARDIOGRAM (TEE) (N/A) Subjective: Progressing well Blood pressure stable Transfer to stepdown Continue beta blocker and ACE inhibitor for preop MI  Objective: Vital signs in last 24 hours: Temp:  [98 F (36.7 C)-99.2 F (37.3 C)] 98 F (36.7 C) (07/29 1645) Pulse Rate:  [87-100] 87 (07/29 1645) Cardiac Rhythm: Normal sinus rhythm (07/29 1435) Resp:  [14-29] 18 (07/29 1645) BP: (98-148)/(59-90) 131/76 (07/29 1645) SpO2:  [89 %-99 %] 98 % (07/29 1435) Weight:  [203 lb 9.6 oz (92.4 kg)] 203 lb 9.6 oz (92.4 kg) (07/29 0545)  Hemodynamic parameters for last 24 hours:   Stable Intake/Output from previous day: 07/28 0701 - 07/29 0700 In: 2140 [P.O.:1560; I.V.:480; IV Piggyback:100] Out: 2830 [Urine:2800; Chest Tube:30] Intake/Output this shift: No intake/output data recorded.       Exam    General- alert and comfortable   Lungs- clear without rales, wheezes   Cor- regular rate and rhythm, no murmur , gallop   Abdomen- soft, non-tender   Extremities - warm, non-tender, minimal edema   Neuro- oriented, appropriate, no focal weakness   Lab Results:  Recent Labs  08/15/16 0254 08/15/16 1635 08/16/16 0512  WBC 11.6*  --  7.1  HGB 11.7* 12.2 10.6*  HCT 37.0 36.0 33.3*  PLT 262  --  227   BMET:  Recent Labs  08/15/16 0254 08/15/16 1635 08/16/16 0512  NA 136 138 138  K 3.5 3.9 3.6  CL 107 101 106  CO2 23  --  26  GLUCOSE 110* 135* 86  BUN <5* 6 6  CREATININE 0.60 0.80 0.65  CALCIUM 8.4*  --  8.7*    PT/INR: No results for input(s): LABPROT, INR in the last 72 hours. ABG    Component Value Date/Time   PHART 7.356 08/14/2016 0124   HCO3 19.3 (L) 08/14/2016 0124   TCO2 25 08/15/2016 1635   ACIDBASEDEF 5.0 (H) 08/14/2016 0124   O2SAT 93.0 08/14/2016 0124   CBG (last 3)   Recent Labs  08/16/16 0754 08/16/16 1134 08/16/16 1643   GLUCAP 82 112* 117*    Assessment/Plan: S/P Procedure(s) (LRB): OFF PUMP CORONARY ARTERY BYPASS GRAFTING (CABG) X 1.  LIMA TO LAD (N/A) TRANSESOPHAGEAL ECHOCARDIOGRAM (TEE) (N/A) Transfer to stepdown Continue diuresis Titrate cardiac meds prior to discharge   LOS: 4 days    Kathlee Nationseter Van Trigt III 08/16/2016

## 2016-08-17 ENCOUNTER — Inpatient Hospital Stay (HOSPITAL_COMMUNITY): Payer: Self-pay

## 2016-08-17 LAB — CBC
HCT: 33.4 % — ABNORMAL LOW (ref 36.0–46.0)
Hemoglobin: 10.7 g/dL — ABNORMAL LOW (ref 12.0–15.0)
MCH: 29.3 pg (ref 26.0–34.0)
MCHC: 32 g/dL (ref 30.0–36.0)
MCV: 91.5 fL (ref 78.0–100.0)
Platelets: 272 10*3/uL (ref 150–400)
RBC: 3.65 MIL/uL — ABNORMAL LOW (ref 3.87–5.11)
RDW: 15.1 % (ref 11.5–15.5)
WBC: 6.6 10*3/uL (ref 4.0–10.5)

## 2016-08-17 LAB — BASIC METABOLIC PANEL
Anion gap: 10 (ref 5–15)
BUN: 11 mg/dL (ref 6–20)
CO2: 25 mmol/L (ref 22–32)
Calcium: 9.1 mg/dL (ref 8.9–10.3)
Chloride: 104 mmol/L (ref 101–111)
Creatinine, Ser: 0.6 mg/dL (ref 0.44–1.00)
GFR calc Af Amer: >60 mL/min (ref >60)
GFR calc non Af Amer: >60 mL/min (ref >60)
Glucose, Bld: 130 mg/dL — ABNORMAL HIGH (ref 65–99)
Potassium: 3.6 mmol/L (ref 3.5–5.1)
Sodium: 139 mmol/L (ref 135–145)

## 2016-08-17 LAB — GLUCOSE, CAPILLARY
GLUCOSE-CAPILLARY: 113 mg/dL — AB (ref 65–99)
GLUCOSE-CAPILLARY: 97 mg/dL (ref 65–99)
GLUCOSE-CAPILLARY: 147 mg/dL — AB (ref 65–99)
Glucose-Capillary: 121 mg/dL — ABNORMAL HIGH (ref 65–99)

## 2016-08-17 MED ORDER — FUROSEMIDE 40 MG PO TABS
40.0000 mg | ORAL_TABLET | Freq: Once | ORAL | Status: AC
  Administered 2016-08-17: 40 mg via ORAL
  Filled 2016-08-17: qty 1

## 2016-08-17 MED ORDER — ASPIRIN EC 81 MG PO TBEC
81.0000 mg | DELAYED_RELEASE_TABLET | Freq: Every day | ORAL | Status: DC
  Administered 2016-08-17: 81 mg via ORAL
  Filled 2016-08-17: qty 1

## 2016-08-17 MED ORDER — POTASSIUM CHLORIDE CRYS ER 20 MEQ PO TBCR
30.0000 meq | EXTENDED_RELEASE_TABLET | Freq: Once | ORAL | Status: AC
  Administered 2016-08-17: 30 meq via ORAL
  Filled 2016-08-17: qty 1

## 2016-08-17 MED ORDER — LACTULOSE 10 GM/15ML PO SOLN
20.0000 g | Freq: Once | ORAL | Status: AC
  Administered 2016-08-17: 20 g via ORAL
  Filled 2016-08-17: qty 30

## 2016-08-17 MED ORDER — LISINOPRIL 10 MG PO TABS
10.0000 mg | ORAL_TABLET | Freq: Every day | ORAL | Status: DC
  Administered 2016-08-17: 10 mg via ORAL
  Filled 2016-08-17: qty 1

## 2016-08-17 NOTE — Progress Notes (Signed)
CARDIAC REHAB PHASE I   PRE:  Rate/Rhythm: 84 SR  BP:  Sitting: 139/83        SaO2: 96 RA  MODE:  Ambulation: 470 ft   POST:  Rate/Rhythm: 100 SR  BP:  Sitting: 131/79         SaO2: 98 RA  Pt in bed, states she is tired, hasn't slept well at night, agreeable to walk. Pt ambulated 470 ft on RA, rolling walker, assist x1, slow, steady gait, tolerated well with no complaints. Encouraged oob as tolerated, additional ambulation x2 today. Pt to recliner after walk, call bell within reach. Will follow.    [phone] Catherine GrapesEmily C Aquinnah Devin, RN, BSN 08/17/2016 10:21 AM

## 2016-08-17 NOTE — Progress Notes (Addendum)
      [address]       [place] [id number]             [phone]        4 Days Post-Op Procedure(s) (LRB): OFF PUMP CORONARY ARTERY BYPASS GRAFTING (CABG) X 1.  LIMA TO LAD (N/A) TRANSESOPHAGEAL ECHOCARDIOGRAM (TEE) (N/A)  Subjective: Patient with sternal, incisional pain. No bowel movement yet.  Objective: Vital signs in last 24 hours: Temp:  [98 F (36.7 C)-98.9 F (37.2 C)] 98.4 F (36.9 C) (07/30 0437) Pulse Rate:  [80-92] 80 (07/30 0437) Cardiac Rhythm: Normal sinus rhythm (07/29 2032) Resp:  [13-21] 13 (07/30 0437) BP: (98-139)/(60-90) 137/80 (07/30 0437) SpO2:  [91 %-99 %] 94 % (07/30 0437) Weight:  [91.4 kg (201 lb 9.6 oz)] 91.4 kg (201 lb 9.6 oz) (07/30 0437)  Pre op weight 93 kg Current Weight  08/17/16 91.4 kg (201 lb 9.6 oz)       Intake/Output from previous day: 07/29 0701 - 07/30 0700 In: 760 [P.O.:720; I.V.:40] Out: 1450 [Urine:1450]   Physical Exam:  Cardiovascular: RRR Pulmonary: Slightly diminished at bases Abdomen: Soft, non tender, bowel sounds present. Extremities: Trace bilateral lower extremity edema. Wound: Sternal dressing removed and wound is clean and dry.  No erythema or signs of infection.  Lab Results: CBC: Recent Labs  08/15/16 0254 08/15/16 1635 08/16/16 0512  WBC 11.6*  --  7.1  HGB 11.7* 12.2 10.6*  HCT 37.0 36.0 33.3*  PLT 262  --  227   BMET:  Recent Labs  08/15/16 0254 08/15/16 1635 08/16/16 0512  NA 136 138 138  K 3.5 3.9 3.6  CL 107 101 106  CO2 23  --  26  GLUCOSE 110* 135* 86  BUN <5* 6 6  CREATININE 0.60 0.80 0.65  CALCIUM 8.4*  --  8.7*    PT/INR:  Lab Results  Component Value Date   INR 1.14 08/13/2016   INR 1.06 08/13/2016   ABG:  INR: Will add last result for INR, ABG once components are confirmed Will add last 4 CBG results once components are confirmed  Assessment/Plan:  1. CV - S/p NSTEMI. SR in the 80's. On Lopressor 25 mg bid, Lisinopril 5 mg daily, and  Plavix 75 mg daily. Will increase Lisinopril to 10 mg daily for better BP control. 2.  Pulmonary - On room air. CXR appears to show small bilateral pleural effusions, no pneumothorax. Encourage incentive spirometer. 3.  Acute blood loss anemia - H and H yesterday 10.6 and 33.3 4. LOC constipation 5. Likely discharge in am  ZIMMERMAN,DONIELLE MPA-C 08/17/2016,7:07 AM  I have seen and examined the patient and agree with the assessment and plan as outlined.  Tentatively plan d/c home tomorrow.  Instructions given.  Purcell Nailslarence H Cristoval Teall, MD 08/17/2016 3:37 PM

## 2016-08-18 MED ORDER — ONDANSETRON HCL 4 MG PO TABS: 4.0000 mg | ORAL_TABLET | Freq: Three times a day (TID) | ORAL | 0 refills | Status: AC | PRN

## 2016-08-18 MED ORDER — ATORVASTATIN CALCIUM 40 MG PO TABS: 40.0000 mg | ORAL_TABLET | Freq: Every day | ORAL | 1 refills | Status: AC

## 2016-08-18 MED ORDER — METOPROLOL TARTRATE 25 MG PO TABS: 25.0000 mg | ORAL_TABLET | Freq: Two times a day (BID) | ORAL | 1 refills | Status: AC

## 2016-08-18 MED ORDER — ONDANSETRON HCL 4 MG PO TABS: 4.0000 mg | ORAL_TABLET | Freq: Three times a day (TID) | ORAL | 0 refills | Status: DC | PRN

## 2016-08-18 MED ORDER — LISINOPRIL 10 MG PO TABS: 10.0000 mg | ORAL_TABLET | Freq: Every day | ORAL | 1 refills | Status: AC

## 2016-08-18 MED ORDER — OXYCODONE HCL 5 MG PO TABS: ORAL_TABLET | ORAL | 0 refills | Status: DC

## 2016-08-18 MED ORDER — CLOPIDOGREL BISULFATE 75 MG PO TABS: 75.0000 mg | ORAL_TABLET | Freq: Every day | ORAL | 1 refills | Status: AC

## 2016-08-18 MED ORDER — ASPIRIN 81 MG PO TBEC: 81.0000 mg | DELAYED_RELEASE_TABLET | Freq: Every day | ORAL | Status: AC

## 2016-08-18 NOTE — Progress Notes (Signed)
Pt. Discharged  Discharge information reviewed and given along with Rx All personal belongings given to Pt.  Education discussed and reviewed with teach-back IV was d/c after given IV zofran Tele d/c  Pt. Waiting in room for ride

## 2016-08-18 NOTE — Care Management Note (Signed)
Case Management Note Original Note Created Leone Havenaylor, Deborah Clinton, RN 08/13/2016, 10:28 AM     Patient Details  Name: Catherine Goodman MRN: [id number] Date of Birth: 12/10/1980  Subjective/Objective:   From home, presents with chest pain with elevated trops, possible NSTEMI, psa (marijauna and cocaine), obesity, anxiety and depression, she is for cardiac cath today.                 Action/Plan: NCM will follow for dc needs.  Expected Discharge Date:  08/18/16               Expected Discharge Plan:  Home/Self Care  In-House Referral:  NA  Discharge planning Services  CM Consult  Post Acute Care Choice:  NA Choice offered to:  NA  DME Arranged:    DME Agency:     HH Arranged:    HH Agency:     Status of Service:  Completed, signed off  If discussed at Long Length of Stay Meetings, dates discussed:  7/31  Discharge Disposition: home/self care   Additional Comments:  08/18/16- 0900- Donn PieriniKristi Consandra Laske RN, CM- pt for d/c home today- no CM needs noted for discharge.   Zenda AlpersWebster, Catherine Hall, RN 08/18/2016, 9:55 AM [phone]

## 2016-08-18 NOTE — Progress Notes (Addendum)
      [address]       [place] [id number]             [phone]        5 Days Post-Op Procedure(s) (LRB): OFF PUMP CORONARY ARTERY BYPASS GRAFTING (CABG) X 1.  LIMA TO LAD (N/A) TRANSESOPHAGEAL ECHOCARDIOGRAM (TEE) (N/A)  Subjective: Patient with sternal, incisional pain, but slowly improving. She had a bowel movement. She wants to go home.  Objective: Vital signs in last 24 hours: Temp:  [98.3 F (36.8 C)-99 F (37.2 C)] 98.3 F (36.8 C) (07/31 0515) Pulse Rate:  [80-100] 91 (07/31 0516) Cardiac Rhythm: Normal sinus rhythm (07/30 1900) Resp:  [14-27] 23 (07/31 0516) BP: (126-138)/(80-94) 136/87 (07/31 0516) SpO2:  [92 %-99 %] 95 % (07/31 0516) Weight:  [88.3 kg (194 lb 11.2 oz)] 88.3 kg (194 lb 11.2 oz) (07/31 0516)  Pre op weight 93 kg Current Weight  08/18/16 88.3 kg (194 lb 11.2 oz)       Intake/Output from previous day: 07/30 0701 - 07/31 0700 In: 720 [P.O.:720] Out: 100 [Urine:100]   Physical Exam:  Cardiovascular: RRR Pulmonary: Slightly diminished at bases Abdomen: Soft, non tender, bowel sounds present. Extremities: No lower extremity edema. Wound: Sternal dressing removed and wound is clean and dry.  No erythema or signs of infection.  Lab Results: CBC:  Recent Labs  08/16/16 0512 08/17/16 0711  WBC 7.1 6.6  HGB 10.6* 10.7*  HCT 33.3* 33.4*  PLT 227 272   BMET:   Recent Labs  08/16/16 0512 08/17/16 0711  NA 138 139  K 3.6 3.6  CL 106 104  CO2 26 25  GLUCOSE 86 130*  BUN 6 11  CREATININE 0.65 0.60  CALCIUM 8.7* 9.1    PT/INR:  Lab Results  Component Value Date   INR 1.14 08/13/2016   INR 1.06 08/13/2016   ABG:  INR: Will add last result for INR, ABG once components are confirmed Will add last 4 CBG results once components are confirmed  Assessment/Plan:  1. CV - S/p NSTEMI. SR in the 80's. On Lopressor 25 mg bid, Lisinopril 10 mg daily, and Plavix 75 mg daily.  2.  Pulmonary - On room  air.Encourage incentive spirometer. 3.  Acute blood loss anemia - H and H yesterday 10.7 and 33.4 4. Will remove chest tube sutures in the office after discharge 5. At patient request, will give Zofran as Oxy sometimes upsets her stomach 6.Discharge  ZIMMERMAN,DONIELLE MPA-C 08/18/2016,7:09 AM

## 2016-08-18 NOTE — Progress Notes (Signed)
Patient walked for about 470 feet without walker and no exertion noted.

## 2016-08-18 NOTE — Progress Notes (Signed)
Progress Note  Patient Name: Catherine Goodman Date of Encounter: 08/18/2016  Primary Cardiologist: New to [facility]  Subjective   36 year old female with cocaine abuse and ETOH abuse, uses adderall for energy, with anxiety and depression. Reports smoking 1 pack of cigarettes per day for past 24 years. Presented to Emergency room at [facility] on 7/25/201. EKG showed some T-wave abnormalities in V2 and V3 and additionally there was an abnormal troponin. She has ruled in for non-STEMI and was transferred to [facility]. Had a cardiac cath done on 7/26 by Dr. Okey Dupre and was found to have significant ostial LAD dz and underwent emergent CABG LIMA to LAD on 7/26.   Plan for discharge today. She has ambulated, is sitting up in bed eating and appears to be in good spirits.  Blood pressure 136/87, pulse 91, temperature 98.3 F (36.8 C), temperature source Oral, resp. rate (!) 23, height 5\' 6"  (1.676 m), weight 194 lb 11.2 oz (88.3 kg), last menstrual period 08/17/2016, SpO2 95 %.   Inpatient Medications    Scheduled Meds: . acetaminophen  1,000 mg Oral Q6H  . aspirin EC  81 mg Oral Daily  . atorvastatin  40 mg Oral q1800  . bisacodyl  10 mg Oral Daily   Or  . bisacodyl  10 mg Rectal Daily  . clopidogrel  75 mg Oral Daily  . docusate sodium  200 mg Oral Daily  . lisinopril  10 mg Oral Daily  . metoprolol tartrate  25 mg Oral BID  . pantoprazole  40 mg Oral Daily  . sodium chloride flush  3 mL Intravenous Q12H   Continuous Infusions: . sodium chloride     PRN Meds: sodium chloride, magnesium hydroxide, metoprolol tartrate, morphine injection, ondansetron (ZOFRAN) IV, oxyCODONE, sodium chloride flush, traMADol   Vital Signs    Vitals:   08/17/16 1942 08/17/16 2113 08/18/16 0515 08/18/16 0516  BP: 126/84 (!) 136/94  136/87  Pulse: 88 100 90 91  Resp: (!) 27  (!) 23 (!) 23  Temp: 98.6 F (37 C)  98.3 F (36.8 C)   TempSrc: Oral  Oral   SpO2: 99%  96% 95%  Weight:    194 lb 11.2  oz (88.3 kg)  Height:        Intake/Output Summary (Last 24 hours) at 08/18/16 0805 Last data filed at 08/18/16 0981  Gross per 24 hour  Intake              720 ml  Output              700 ml  Net               20 ml   Filed Weights   08/16/16 0545 08/17/16 0437 08/18/16 0516  Weight: 203 lb 9.6 oz (92.4 kg) 201 lb 9.6 oz (91.4 kg) 194 lb 11.2 oz (88.3 kg)    Telemetry    None   Physical Exam   GEN: Well nourished, well developed HEENT: normal  Neck: no JVD, carotid bruits, or masses Cardiac: RRR. no murmurs, rubs, or gallops,no edema. Intact distal pulses bilaterally. + well appearing surgical scar to chest. Respiratory: clear to auscultation bilaterally, normal work of breathing GI: soft, nontender, nondistended, + BS MS: no deformity or atrophy  Skin: warm and dry, no rash Neuro: Alert and Oriented x 3, Strength and sensation are intact Psych:   Full affect  Labs    Chemistry Recent Labs Lab 08/13/16 1021  08/15/16 0254 08/15/16 1635 08/16/16 0512 08/17/16 0711  NA 137  < > 136 138 138 139  K 3.6  < > 3.5 3.9 3.6 3.6  CL 111  < > 107 101 106 104  CO2 18*  < > 23  --  26 25  GLUCOSE 107*  < > 110* 135* 86 130*  BUN 14  < > <5* 6 6 11   CREATININE 0.67  < > 0.60 0.80 0.65 0.60  CALCIUM 8.7*  < > 8.4*  --  8.7* 9.1  PROT 6.7  --   --   --   --   --   ALBUMIN 4.0  --   --   --   --   --   AST 28  --   --   --   --   --   ALT 19  --   --   --   --   --   ALKPHOS 63  --   --   --   --   --   BILITOT 1.1  --   --   --   --   --   GFRNONAA >60  < > >60  --  >60 >60  GFRAA >60  < > >60  --  >60 >60  ANIONGAP 8  < > 6  --  6 10  < > = values in this interval not displayed.   Hematology Recent Labs Lab 08/15/16 0254 08/15/16 1635 08/16/16 0512 08/17/16 0711  WBC 11.6*  --  7.1 6.6  RBC 4.01  --  3.57* 3.65*  HGB 11.7* 12.2 10.6* 10.7*  HCT 37.0 36.0 33.3* 33.4*  MCV 92.3  --  93.3 91.5  MCH 29.2  --  29.7 29.3  MCHC 31.6  --  31.8 32.0  RDW 15.4   --  15.4 15.1  PLT 262  --  227 272    Cardiac Enzymes Recent Labs Lab 08/12/16 2251 08/13/16 0412 08/13/16 1021  TROPONINI 1.71* 1.36* 0.95*   No results for input(s): TROPIPOC in the last 168 hours.   BNPNo results for input(s): BNP, PROBNP in the last 168 hours.   DDimer No results for input(s): DDIMER in the last 168 hours.   Radiology    Dg Chest 2 View  Result Date: 08/17/2016 CLINICAL DATA:  CABG. EXAM: CHEST  2 VIEW COMPARISON:  08/16/2016 . FINDINGS: Interim removal right IJ sheath. Prior median sternotomy. Cardiomegaly with normal pulmonary vascularity. Low lung volumes with mild basilar atelectasis. Small bilateral pleural effusions. No pneumothorax . IMPRESSION: 1. Interim removal right IJ sheath. 2. Prior median sternotomy. Stable mild cardiomegaly. No pulmonary venous congestion. 3. Low lung volumes with bibasilar atelectasis. Small bilateral pleural effusions. Electronically Signed   By: Maisie Fushomas  Register   On: 08/17/2016 07:19    Cardiac Studies   08/12/2016 Procedures   IABP Insertion  Intravascular Ultrasound/IVUS  Left Heart Cath and Coronary Angiography  Conclusion   Conclusions: 1. Severe single vessel coronary artery disease with hazy 80% ostial LAD stenosis. IVUS of this lesion demonstrates eccentric ruptured plaque. Minimal luminal area is 5.3 mm. 2. Mild, nonobstructive CAD involving the LCx and RCA. 3. Marked mid and apical anterior hypokinesis with otherwise preserved LV contraction. LVEF approximately 30-35%. 4. Mildly elevated left ventricular filling pressure. 5. Successful placement of intra-aortic balloon pump via the right common femoral artery.  Recommendations: 1. Given ostial LAD stenosis with ongoing chest pain, surgical consultation has been obtained to evaluate  for LIMA to LAD. Ostial nature of the lesion with plaque extending back into the distal LMCA makes this a suboptimal PCI target. 2. Continue intra-aortic balloon pump at 1:1  with up titration of nitroglycerin for relief of chest pain. 3. Aggressive secondary prevention. 4. Obtain transthoracic echocardiogram to reevaluate LV function and to assess for significant valvular abnormalities.   Transthoracic Echocardiography 08/13/2016  Study Conclusions  - Left ventricle: The cavity size was normal. Systolic function was   mildly reduced. The estimated ejection fraction was in the range   of 45% to 50%. Mild hypokinesis of the midanteroseptal and   anterior myocardium.  Patient Profile     This 36 year old female has a history of substance abuse and some psychiatric issues.  She is followed at mental health and does not have a primary physician.  She has been obese.  She has smoked one pack cigarettes per day for the past 24 years.  She is a family history of cardiac disease in that her father had bypass she says at about the age that she had it.  She has been under severe situational stress.  She admits to using cocaine in the past maybe 2 months ago and yesterday took an Adderall to gain energy.  The Adderall she obtained from an unknown person.  She evidently was awake most the evening and today was lying down and had the onset of midsternal pain described as a rushing type pain as somebody was sitting on her chest.  She states it lasted 30-40 minutes.  [place] and had an EKG there that showed some T-wave abnormalities in V2 and V3 with an abnormal troponin.  Arrangements were made for her to be transferred here.  She had recurrent pain in the emergency room and received heparin and was placed on nitroglycerin.  She is currently pain-free.  She was hospitalized for an overdose a few years ago with Neurontin and other medications.  She says that she drinks sometimes as much as a half of a bottle of alcohol daily and also drinks beer.  Assessment & Plan    1. CV - S/p NSTEM and CABG LIMA to LAD.  Lopressor 25 mg bid  Lisinopril 10 mg  daily  Plavix 75 mg daily.   Plan is for discharge today. We discuss diet and nutrition changes. Smoking cessation, drug cessation and to continue seeking treatment for her anxiety and depression.   Follow-up appointment scheduled   Laqueta LindenKoneswaran, Suresh A, MD. Go on 08/31/2016.   Specialty:  Cardiology Why:  @ 3:40pm. Please arrive at least 10 minutes early Contact information: [address] [place] [id number] [phone]   SignedDorthula Matas, Yemaya Barnier G, PA-C  08/18/2016, 8:05 AM

## 2016-08-20 ENCOUNTER — Telehealth (HOSPITAL_COMMUNITY): Payer: Self-pay | Admitting: *Deleted

## 2016-08-25 ENCOUNTER — Encounter (INDEPENDENT_AMBULATORY_CARE_PROVIDER_SITE_OTHER): Payer: Self-pay

## 2016-08-25 ENCOUNTER — Other Ambulatory Visit: Payer: Self-pay | Admitting: *Deleted

## 2016-08-25 DIAGNOSIS — G8918 Other acute postprocedural pain: Secondary | ICD-10-CM

## 2016-08-25 DIAGNOSIS — Z4802 Encounter for removal of sutures: Secondary | ICD-10-CM

## 2016-08-25 MED ORDER — OXYCODONE HCL 5 MG PO TABS: ORAL_TABLET | ORAL | 0 refills | Status: AC

## 2016-08-29 ENCOUNTER — Emergency Department (HOSPITAL_COMMUNITY): Payer: Self-pay

## 2016-08-29 ENCOUNTER — Encounter (HOSPITAL_COMMUNITY): Payer: Self-pay | Admitting: Emergency Medicine

## 2016-08-29 ENCOUNTER — Emergency Department (HOSPITAL_COMMUNITY)
Admission: EM | Admit: 2016-08-29 | Discharge: 2016-08-29 | Disposition: A | Discharge status: Home / Self Care | Payer: Self-pay | Attending: Emergency Medicine | Admitting: Emergency Medicine

## 2016-08-29 DIAGNOSIS — G8918 Other acute postprocedural pain: Secondary | ICD-10-CM | POA: Insufficient documentation

## 2016-08-29 DIAGNOSIS — I2510 Atherosclerotic heart disease of native coronary artery without angina pectoris: Secondary | ICD-10-CM | POA: Insufficient documentation

## 2016-08-29 DIAGNOSIS — F17210 Nicotine dependence, cigarettes, uncomplicated: Secondary | ICD-10-CM | POA: Insufficient documentation

## 2016-08-29 DIAGNOSIS — Z951 Presence of aortocoronary bypass graft: Secondary | ICD-10-CM | POA: Insufficient documentation

## 2016-08-29 LAB — CBC WITH DIFFERENTIAL/PLATELET
BASOS PCT: 0 %
Basophils Absolute: 0.1 10*3/uL (ref 0.0–0.1)
EOS ABS: 0.5 10*3/uL (ref 0.0–0.7)
Eosinophils Relative: 5 %
HEMATOCRIT: 36.8 % (ref 36.0–46.0)
HEMOGLOBIN: 11.9 g/dL — AB (ref 12.0–15.0)
LYMPHS ABS: 2.7 10*3/uL (ref 0.7–4.0)
Lymphocytes Relative: 24 %
MCH: 29.5 pg (ref 26.0–34.0)
MCHC: 32.3 g/dL (ref 30.0–36.0)
MCV: 91.1 fL (ref 78.0–100.0)
Monocytes Absolute: 0.5 10*3/uL (ref 0.1–1.0)
Monocytes Relative: 5 %
NEUTROS ABS: 7.6 10*3/uL (ref 1.7–7.7)
NEUTROS PCT: 67 %
Platelets: 624 10*3/uL — ABNORMAL HIGH (ref 150–400)
RBC: 4.04 MIL/uL (ref 3.87–5.11)
RDW: 14.6 % (ref 11.5–15.5)
WBC: 11.3 10*3/uL — AB (ref 4.0–10.5)

## 2016-08-29 LAB — COMPREHENSIVE METABOLIC PANEL
ALBUMIN: 3.8 g/dL (ref 3.5–5.0)
ALK PHOS: 78 U/L (ref 38–126)
ALT: 14 U/L (ref 14–54)
AST: 14 U/L — ABNORMAL LOW (ref 15–41)
Anion gap: 9 (ref 5–15)
BUN: 9 mg/dL (ref 6–20)
CALCIUM: 9.7 mg/dL (ref 8.9–10.3)
CO2: 27 mmol/L (ref 22–32)
CREATININE: 0.65 mg/dL (ref 0.44–1.00)
Chloride: 106 mmol/L (ref 101–111)
GFR calc Af Amer: >60 mL/min (ref >60)
GFR calc non Af Amer: >60 mL/min (ref >60)
GLUCOSE: 84 mg/dL (ref 65–99)
Potassium: 4.1 mmol/L (ref 3.5–5.1)
SODIUM: 142 mmol/L (ref 135–145)
Total Bilirubin: 0.3 mg/dL (ref 0.3–1.2)
Total Protein: 8 g/dL (ref 6.5–8.1)

## 2016-08-29 LAB — D-DIMER, QUANTITATIVE: D-Dimer, Quant: 4.16 ug/mL-FEU — ABNORMAL HIGH (ref 0.00–0.50)

## 2016-08-29 MED ORDER — HYDROMORPHONE HCL 1 MG/ML IJ SOLN
1.0000 mg | Freq: Once | INTRAMUSCULAR | Status: AC
  Administered 2016-08-29: 1 mg via INTRAVENOUS
  Filled 2016-08-29: qty 1

## 2016-08-29 MED ORDER — IOPAMIDOL (ISOVUE-370) INJECTION 76%
100.0000 mL | Freq: Once | INTRAVENOUS | Status: AC | PRN
  Administered 2016-08-29: 100 mL via INTRAVENOUS

## 2016-08-29 MED ORDER — HYDROMORPHONE HCL 4 MG PO TABS: 4.0000 mg | ORAL_TABLET | Freq: Four times a day (QID) | ORAL | 0 refills | Status: DC | PRN

## 2016-08-29 MED ORDER — ONDANSETRON HCL 4 MG/2ML IJ SOLN
4.0000 mg | Freq: Once | INTRAMUSCULAR | Status: AC
  Administered 2016-08-29: 4 mg via INTRAVENOUS
  Filled 2016-08-29: qty 2

## 2016-08-29 NOTE — ED Provider Notes (Signed)
[facility]-EMERGENCY DEPT Provider Note   CSN: [id number] Arrival date & time: 08/29/16  1611     History   Chief Complaint Chief Complaint  Patient presents with  . Post-op Problem    HPI Catherine Goodman is a 36 y.o. female.  Patient complains of right side chest pain with movement and inspiration. She had bypass surgery done recently and had her drains removed just a few days ago.   The history is provided by the patient.  Chest Pain   This is a new problem. The current episode started more than 2 days ago. The problem occurs constantly. The problem has not changed since onset.The pain is associated with movement. The pain is present in the lateral region. The pain is at a severity of 4/10. The pain is moderate. The quality of the pain is described as sharp. Pertinent negatives include no abdominal pain, no back pain, no cough and no headaches.  Pertinent negatives for past medical history include no seizures.    Past Medical History:  Diagnosis Date  . Anxiety   . Coronary artery disease involving native coronary artery of native heart with unstable angina pectoris (HCC) 08/12/2016  . Depression   . History of substance abuse   . Non-STEMI (non-ST elevated myocardial infarction) (HCC) 08/12/2016  . NSTEMI (non-ST elevated myocardial infarction) (HCC) 08/12/2016  . Obesity (BMI 30-39.9)   . 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 Tobacco abuse     Patient Active Problem List   Diagnosis Date Noted  . S/P emergency off-pump CABG x 1 08/13/2016  . Tobacco abuse   . Non-STEMI (non-ST elevated myocardial infarction) (HCC) 08/12/2016  . Family history of cardiovascular disease 08/12/2016  . Chest pain 08/12/2016  . Coronary artery disease involving native coronary artery of native heart with unstable angina pectoris (HCC) 08/12/2016  . Obesity (BMI 30-39.9)   . History of substance abuse     Past Surgical History:  Procedure Laterality Date  .  ADENOIDECTOMY  2002  . CESAREAN SECTION  2010  . CORONARY ARTERY BYPASS GRAFT N/A 08/13/2016   Procedure: OFF PUMP CORONARY ARTERY BYPASS GRAFTING (CABG) X 1.  LIMA TO LAD;  Surgeon: Purcell Nails, MD;  Location: [facility] OR;  Service: Open Heart Surgery;  Laterality: N/A;  . IABP INSERTION N/A 08/13/2016   Procedure: IABP Insertion;  Surgeon: Yvonne Kendall, MD;  Location: [facility] INVASIVE CV LAB;  Service: Cardiovascular;  Laterality: N/A;  . INGUINAL HERNIA REPAIR Right ~ 2012   w/mesh  . INTRAVASCULAR ULTRASOUND/IVUS N/A 08/13/2016   Procedure: Intravascular Ultrasound/IVUS;  Surgeon: Yvonne Kendall, MD;  Location: [facility] INVASIVE CV LAB;  Service: Cardiovascular;  Laterality: N/A;  . LEFT HEART CATH AND CORONARY ANGIOGRAPHY N/A 08/13/2016   Procedure: Left Heart Cath and Coronary Angiography;  Surgeon: Yvonne Kendall, MD;  Location: [facility] INVASIVE CV LAB;  Service: Cardiovascular;  Laterality: N/A;  . TEE WITHOUT CARDIOVERSION N/A 08/13/2016   Procedure: TRANSESOPHAGEAL ECHOCARDIOGRAM (TEE);  Surgeon: Purcell Nails, MD;  Location: [facility] OR;  Service: Open Heart Surgery;  Laterality: N/A;  . TONSILLECTOMY  1992  . TUBAL LIGATION  2010    OB History    No data available       Home Medications    Prior to Admission medications   Medication Sig Start Date End Date Taking? Authorizing Provider  aspirin EC 81 MG EC tablet Take 1 tablet (81 mg total) by mouth daily. 08/18/16  Yes  Doree Fudge M, PA-C  atorvastatin (LIPITOR) 40 MG tablet Take 1 tablet (40 mg total) by mouth daily at 6 PM. 08/18/16  Yes Ardelle Balls, PA-C  clopidogrel (PLAVIX) 75 MG tablet Take 1 tablet (75 mg total) by mouth daily. 08/18/16  Yes Joycelyn Man, Donielle M, PA-C  diphenhydramine-acetaminophen (TYLENOL PM) 25-500 MG TABS tablet Take 2 tablets by mouth at bedtime as needed.   Yes [provider]  lisinopril (PRINIVIL,ZESTRIL) 10 MG tablet Take 1 tablet (10 mg total) by mouth daily. 08/18/16  Yes  Doree Fudge M, PA-C  metoprolol tartrate (LOPRESSOR) 25 MG tablet Take 1 tablet (25 mg total) by mouth 2 (two) times daily. 08/18/16  Yes Doree Fudge M, PA-C  ondansetron (ZOFRAN) 4 MG tablet Take 1 tablet (4 mg total) by mouth every 8 (eight) hours as needed for nausea or vomiting. 08/18/16  Yes Doree Fudge M, PA-C  oxyCODONE (OXY IR/ROXICODONE) 5 MG immediate release tablet Take 5 mg by mouth every 4-6 hours PRN severe pain. 08/25/16  Yes Loreli Slot, MD  busPIRone (BUSPAR) 10 MG tablet Take 10 mg by mouth daily.    [provider]  HYDROmorphone (DILAUDID) 4 MG tablet Take 1 tablet (4 mg total) by mouth every 6 (six) hours as needed for severe pain. 08/29/16   Bethann Berkshire, MD    Family History History reviewed. No pertinent family history.  Social History Social History  Substance Use Topics  . Smoking status: Current Every Day Smoker    Packs/day: 1.00    Years: 24.00    Types: Cigarettes  . Smokeless tobacco: Never Used  . Alcohol use 36.0 oz/week    12 Cans of beer, 48 Shots of liquor per week     Comment: 08/12/2016 "1/5th of liquor at least 3 times/week"     Allergies   Patient has no known allergies.   Review of Systems Review of Systems  Constitutional: Negative for appetite change and fatigue.  HENT: Negative for congestion, ear discharge and sinus pressure.   Eyes: Negative for discharge.  Respiratory: Negative for cough.   Cardiovascular: Positive for chest pain.  Gastrointestinal: Negative for abdominal pain and diarrhea.  Genitourinary: Negative for frequency and hematuria.  Musculoskeletal: Negative for back pain.  Skin: Negative for rash.  Neurological: Negative for seizures and headaches.  Psychiatric/Behavioral: Negative for hallucinations.     Physical Exam Updated Vital Signs BP 104/73   Pulse 75   Temp 98 F (36.7 C) (Oral)   Resp 15   Ht 5\' 6"  (1.676 m)   Wt 88 kg (194 lb)   LMP 08/17/2016 Comment:  neg preg test  SpO2 96%   BMI 31.31 kg/m   Physical Exam  Constitutional: She is oriented to person, place, and time. She appears well-developed.  HENT:  Head: Normocephalic.  Eyes: Conjunctivae and EOM are normal. No scleral icterus.  Neck: Neck supple. No thyromegaly present.  Cardiovascular: Normal rate and regular rhythm.  Exam reveals no gallop and no friction rub.   No murmur heard. Pulmonary/Chest: No stridor. She has no wheezes. She has no rales. She exhibits no tenderness.  Abdominal: She exhibits no distension. There is no tenderness. There is no rebound.  Musculoskeletal: Normal range of motion. She exhibits no edema.  Lymphadenopathy:    She has no cervical adenopathy.  Neurological: She is oriented to person, place, and time. She exhibits normal muscle tone. Coordination normal.  Skin: No rash noted. No erythema.  Patient has healing incision to  chest along with healing areas where the drains were removed  Psychiatric: She has a normal mood and affect. Her behavior is normal.     ED Treatments / Results  Labs (all labs ordered are listed, but only abnormal results are displayed) Labs Reviewed  CBC WITH DIFFERENTIAL/PLATELET - Abnormal; Notable for the following:       Result Value   WBC 11.3 (*)    Hemoglobin 11.9 (*)    Platelets 624 (*)    All other components within normal limits  COMPREHENSIVE METABOLIC PANEL - Abnormal; Notable for the following:    AST 14 (*)    All other components within normal limits  D-DIMER, QUANTITATIVE (NOT AT [facility]) - Abnormal; Notable for the following:    D-Dimer, Quant 4.16 (*)    All other components within normal limits    EKG  EKG Interpretation None       Radiology Dg Chest 2 View  Result Date: 08/29/2016 CLINICAL DATA:  Open heart surgery 2 weeks ago, drains removed Wednesday, rib pain since with inspiration and moving RIGHT arm, intermittent chills, history coronary disease post NSTEMI and CABG, smoker EXAM:  CHEST  2 VIEW COMPARISON:  08/17/2016 FINDINGS: Enlargement of cardiac silhouette post median sternotomy. Mediastinal contours and pulmonary vascularity normal. Increased LEFT pleural effusion and basilar atelectasis since prior study. Remaining lungs clear. No pneumothorax or acute osseous findings. IMPRESSION: Increased LEFT pleural effusion and basilar atelectasis. Electronically Signed   By: Ulyses Southward M.D.   On: 08/29/2016 17:33   Ct Angio Chest Pe W And/or Wo Contrast  Result Date: 08/29/2016 CLINICAL DATA:  Recent NSTEMI. Status post CABG 08/13/2016. Patient presents with chest pain. EXAM: CT ANGIOGRAPHY CHEST WITH CONTRAST TECHNIQUE: Multidetector CT imaging of the chest was performed using the standard protocol during bolus administration of intravenous contrast. Multiplanar CT image reconstructions and MIPs were obtained to evaluate the vascular anatomy. CONTRAST:  100 cc Isovue 370 IV. COMPARISON:  Chest radiograph from earlier today. FINDINGS: Cardiovascular: The study is moderate quality for the evaluation of pulmonary embolism, with evaluation of the segmental and subsegmental pulmonary artery branches limited by motion artifact. There are no filling defects in the central, lobar, segmental or subsegmental pulmonary artery branches to suggest acute pulmonary embolism. Thoracic aorta is normal in course and caliber. Main pulmonary artery diameter 3.2 cm, top-normal. Top-normal heart size. Small pericardial effusion/ thickening. Status post CABG. Mediastinum/Nodes: No discrete thyroid nodules. Unremarkable esophagus. No pathologically enlarged axillary, mediastinal or hilar lymph nodes. Lungs/Pleura: No pneumothorax. Tiny focus of pleural gas in the basilar right pleural space, probably due to recent surgery. Small to moderate left pleural effusion. No right pleural effusion. Mild centrilobular emphysema. Mild dependent basilar right lower lobe atelectasis. Moderate compressive atelectasis at the  left lung base. Otherwise no acute consolidative airspace disease, lung masses or significant pulmonary nodules in the aerated portions of the lungs. Upper abdomen: Unremarkable. Musculoskeletal: No aggressive appearing focal osseous lesions. Sternotomy wires are intact. No retrosternal fluid collections. No superficial fluid collections. Review of the MIP images confirms the above findings. IMPRESSION: 1. No evidence of pulmonary embolism. 2. Small to moderate dependent left pleural effusion with moderate left basilar atelectasis. 3. Small pericardial effusion/thickening. 4. Intact sternotomy wires.  No retrosternal fluid collections. Emphysema (ICD10-J43.9). Electronically Signed   By: Delbert Phenix M.D.   On: 08/29/2016 19:42    Procedures Procedures (including critical care time)  Medications Ordered in ED Medications  HYDROmorphone (DILAUDID) injection 1 mg (1 mg Intravenous  Given 08/29/16 1653)  ondansetron (ZOFRAN) injection 4 mg (4 mg Intravenous Given 08/29/16 1653)  iopamidol (ISOVUE-370) 76 % injection 100 mL (100 mLs Intravenous Contrast Given 08/29/16 1900)  HYDROmorphone (DILAUDID) injection 1 mg (1 mg Intravenous Given 08/29/16 1928)     Initial Impression / Assessment and Plan / ED Course  I have reviewed the triage vital signs and the nursing notes.  Pertinent labs & imaging results that were available during my care of the patient were reviewed by me and considered in my medical decision making (see chart for details).     Patient with pleuritic chest pain and pain with movement on the right side. Suspect these are pains related to healing process from the surgery. Labs unremarkable CT scan shows no PE. She'll be given some pain medicines will follow-up her surgeon  Final Clinical Impressions(s) / ED Diagnoses   Final diagnoses:  Post-operative pain    New Prescriptions New Prescriptions   HYDROMORPHONE (DILAUDID) 4 MG TABLET    Take 1 tablet (4 mg total) by mouth every 6  (six) hours as needed for severe pain.     Bethann BerkshireZammit, Vicie Cech, MD 08/29/16 1958

## 2016-08-29 NOTE — ED Triage Notes (Addendum)
Pt reports had open heart surgery x2 weeks ago. Pt reports drains were removed on Wednesday. Pt reports ever since has had intense "rib" pain with inspiration and right arm movement. Pt reports intermittent chills,denies known fevers.

## 2016-08-29 NOTE — Discharge Instructions (Signed)
Follow up with your surgeon this week

## 2016-08-31 ENCOUNTER — Encounter: Payer: Self-pay | Admitting: Cardiovascular Disease

## 2016-08-31 ENCOUNTER — Ambulatory Visit (INDEPENDENT_AMBULATORY_CARE_PROVIDER_SITE_OTHER): Payer: Self-pay | Admitting: Cardiovascular Disease

## 2016-08-31 VITALS — BP 112/82 | HR 102 | Ht 69.0 in | Wt 199.0 lb

## 2016-08-31 DIAGNOSIS — Z951 Presence of aortocoronary bypass graft: Secondary | ICD-10-CM

## 2016-08-31 DIAGNOSIS — F1920 Other psychoactive substance dependence, uncomplicated: Secondary | ICD-10-CM

## 2016-08-31 DIAGNOSIS — I214 Non-ST elevation (NSTEMI) myocardial infarction: Secondary | ICD-10-CM

## 2016-08-31 DIAGNOSIS — R079 Chest pain, unspecified: Secondary | ICD-10-CM

## 2016-08-31 MED ORDER — TRAMADOL HCL 50 MG PO TABS: 50.0000 mg | ORAL_TABLET | Freq: Four times a day (QID) | ORAL | 0 refills | Status: DC | PRN

## 2016-08-31 NOTE — Patient Instructions (Signed)
Medication Instructions:  Continue all current medications.  Labwork: none  Testing/Procedures: none  Follow-Up: Your physician wants you to follow up in: 6 months.  You will receive a reminder letter in the mail one-two months in advance.  If you don't receive a letter, please call our office to schedule the follow up appointment   Any Other Special Instructions Will Be Listed Below (If Applicable). Please call the office back if Medicaid goes through and we will refer to cardiac rehab.    If you need a refill on your cardiac medications before your next appointment, please call your pharmacy.

## 2016-08-31 NOTE — Addendum Note (Signed)
Addended by: Lesle ChrisHILL, ANGELA G on: 08/31/2016 04:23 PM   Modules accepted: Orders

## 2016-08-31 NOTE — Progress Notes (Addendum)
CARDIOLOGY CONSULT NOTE  Patient ID: Catherine PaschalCharity V Pingleton MRN: [id number] DOB/AGE: 38/04/1980 38 y.o.  Admit date: (Not on file) Primary Physician: Patient, No Pcp Per Referring Physician: Tressie Stalkerlarence Owen  Reason for Consultation: CABG  HPI: Catherine Goodman is a 36 y.o. female who is being seen today for the evaluation of CAD and recent CABG at the request of Dr. Cornelius Moraswen.  She underwent emergent one-vessel CABG with LIMA to the LAD on 08/13/16. She sustained a non-STEMI. She has a history of cocaine and alcohol abuse as well as anxiety and depression. She has a long history of tobacco abuse.  Echocardiogram demonstrated mildly reduced left ventricular systolic function, LVEF 45-50%.  Her primary complaint relates to postoperative chest pain. She was evaluated in the ED on 08/29/16. She was prescribed Dilaudid but she said it made her nauseous and break out in welts on her legs. She has tried Tylenol without relief. She has a history of polysubstance abuse.    Allergies  Allergen Reactions  . Dilaudid [Hydromorphone Hcl]     Rash/nausea     Current Outpatient Prescriptions  Medication Sig Dispense Refill  . aspirin EC 81 MG EC tablet Take 1 tablet (81 mg total) by mouth daily.    Marland Kitchen. atorvastatin (LIPITOR) 40 MG tablet Take 1 tablet (40 mg total) by mouth daily at 6 PM. 30 tablet 1  . busPIRone (BUSPAR) 10 MG tablet Take 10 mg by mouth daily.    . clopidogrel (PLAVIX) 75 MG tablet Take 1 tablet (75 mg total) by mouth daily. 30 tablet 1  . diphenhydramine-acetaminophen (TYLENOL PM) 25-500 MG TABS tablet Take 2 tablets by mouth at bedtime as needed.    Marland Kitchen. lisinopril (PRINIVIL,ZESTRIL) 10 MG tablet Take 1 tablet (10 mg total) by mouth daily. 30 tablet 1  . metoprolol tartrate (LOPRESSOR) 25 MG tablet Take 1 tablet (25 mg total) by mouth 2 (two) times daily. 60 tablet 1  . ondansetron (ZOFRAN) 4 MG tablet Take 1 tablet (4 mg total) by mouth every 8 (eight) hours as needed for nausea or  vomiting. 14 tablet 0  . oxyCODONE (OXY IR/ROXICODONE) 5 MG immediate release tablet Take 5 mg by mouth every 4-6 hours PRN severe pain. 40 tablet 0   No current facility-administered medications for this visit.     Past Medical History:  Diagnosis Date  . Anxiety   . Coronary artery disease involving native coronary artery of native heart with unstable angina pectoris (HCC) 08/12/2016  . Depression   . History of substance abuse   . Non-STEMI (non-ST elevated myocardial infarction) (HCC) 08/12/2016  . NSTEMI (non-ST elevated myocardial infarction) (HCC) 08/12/2016  . Obesity (BMI 30-39.9)   . 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 Tobacco abuse     Past Surgical History:  Procedure Laterality Date  . ADENOIDECTOMY  2002  . CESAREAN SECTION  2010  . CORONARY ARTERY BYPASS GRAFT N/A 08/13/2016   Procedure: OFF PUMP CORONARY ARTERY BYPASS GRAFTING (CABG) X 1.  LIMA TO LAD;  Surgeon: Purcell Nailswen, Clarence H, MD;  [facility];  Service: Open Heart Surgery;  Laterality: N/A;  . IABP INSERTION N/A 08/13/2016   Procedure: IABP Insertion;  Surgeon: Yvonne KendallEnd, Christopher, MD;  Location: [facility] INVASIVE CV LAB;  Service: Cardiovascular;  Laterality: N/A;  . INGUINAL HERNIA REPAIR Right ~ 2012   w/mesh  . INTRAVASCULAR ULTRASOUND/IVUS N/A 08/13/2016   Procedure: Intravascular Ultrasound/IVUS;  Surgeon: End,  Cristal Deer, MD;  Location: [facility] INVASIVE CV LAB;  Service: Cardiovascular;  Laterality: N/A;  . LEFT HEART CATH AND CORONARY ANGIOGRAPHY N/A 08/13/2016   Procedure: Left Heart Cath and Coronary Angiography;  Surgeon: Yvonne Kendall, MD;  Location: [facility] INVASIVE CV LAB;  Service: Cardiovascular;  Laterality: N/A;  . TEE WITHOUT CARDIOVERSION N/A 08/13/2016   Procedure: TRANSESOPHAGEAL ECHOCARDIOGRAM (TEE);  Surgeon: Purcell Nails, MD;  Location: [facility] OR;  Service: Open Heart Surgery;  Laterality: N/A;  . TONSILLECTOMY  1992  . TUBAL LIGATION  2010    Social History   Social History    . Marital status: Married    Spouse name: N/A  . Number of children: N/A  . Years of education: N/A   Occupational History  . Not on file.   Social History Main Topics  . Smoking status: Current Every Day Smoker    Packs/day: 1.00    Years: 24.00    Types: Cigarettes  . Smokeless tobacco: Never Used  . Alcohol use 36.0 oz/week    12 Cans of beer, 48 Shots of liquor per week     Comment: 08/12/2016 "1/5th of liquor at least 3 times/week"  . Drug use: Yes    Types: Marijuana, Other-see comments, Cocaine     Comment: 08/12/2016 "marijuana qd; I have took some Aderol; used cocaine 2 months ago; last used Xanax ~ 2 months ago also"  . Sexual activity: Yes    Birth control/ protection: Surgical   Other Topics Concern  . Not on file   Social History Narrative   Married for 15 years, has 4 children     No family history of premature CAD in 1st degree relatives.  Current Meds  Medication Sig  . aspirin EC 81 MG EC tablet Take 1 tablet (81 mg total) by mouth daily.  Marland Kitchen atorvastatin (LIPITOR) 40 MG tablet Take 1 tablet (40 mg total) by mouth daily at 6 PM.  . busPIRone (BUSPAR) 10 MG tablet Take 10 mg by mouth daily.  . clopidogrel (PLAVIX) 75 MG tablet Take 1 tablet (75 mg total) by mouth daily.  . diphenhydramine-acetaminophen (TYLENOL PM) 25-500 MG TABS tablet Take 2 tablets by mouth at bedtime as needed.  Marland Kitchen lisinopril (PRINIVIL,ZESTRIL) 10 MG tablet Take 1 tablet (10 mg total) by mouth daily.  . metoprolol tartrate (LOPRESSOR) 25 MG tablet Take 1 tablet (25 mg total) by mouth 2 (two) times daily.  . ondansetron (ZOFRAN) 4 MG tablet Take 1 tablet (4 mg total) by mouth every 8 (eight) hours as needed for nausea or vomiting.  Marland Kitchen oxyCODONE (OXY IR/ROXICODONE) 5 MG immediate release tablet Take 5 mg by mouth every 4-6 hours PRN severe pain.      Review of systems complete and found to be negative unless listed above in HPI    Physical exam Blood pressure 112/82, pulse (!) 102,  height 5\' 9"  (1.753 m), weight 199 lb (90.3 kg), last menstrual period 08/17/2016, SpO2 99 %. General: NAD Neck: No JVD, no thyromegaly or thyroid nodule.  Lungs: Clear to auscultation bilaterally with normal respiratory effort. CV: Nondisplaced PMI. Regular rate and rhythm, normal S1/S2, no S3/S4, no murmur.  No peripheral edema.  Healing midline incision.   Abdomen: Soft, no distention.  Skin: Intact without lesions or rashes.  Neurologic: Alert and oriented x 3.  Psych: Normal affect. Extremities: No clubbing or cyanosis.  HEENT: Normal.   ECG: Most recent ECG reviewed.   Labs: Lab Results  Component Value Date/Time  K 4.1 08/29/2016 04:49 PM   BUN 9 08/29/2016 04:49 PM   CREATININE 0.65 08/29/2016 04:49 PM   ALT 14 08/29/2016 04:49 PM   TSH 8.600 (H) 08/12/2016 10:51 PM   HGB 11.9 (L) 08/29/2016 04:49 PM     Lipids: Lab Results  Component Value Date/Time   LDLCALC 123 (H) 08/12/2016 10:51 PM   CHOL 235 (H) 08/12/2016 10:51 PM   TRIG 187 (H) 08/12/2016 10:51 PM   HDL 75 08/12/2016 10:51 PM        ASSESSMENT AND PLAN:  1. Coronary artery disease status post emergent one-vessel CABG with LIMA to LAD in context of non-STEMI on 08/13/16: Continue dual antiplatelet therapy for one year with aspirin and Plavix. Continue Lipitor, lisinopril, and metoprolol. I will make a referral to cardiac rehabilitation.  2. Ischemic cardiomyopathy, LVEF 45-50%: No signs of heart failure. Continue metoprolol and lisinopril.  3. Polysubstance abuse disorder.  4. Post-operative chest pain: I will make a referral to a pain specialist given her history of polysubstance abuse. She has tried Tylenol without relief. I would prefer to avoid NSAIDs given recent non-STEMI. Dilaudid reportedly led to adverse effects. I will prescribe Tramadol 50 mg every 6 hours as needed (30 tablets total, no refills).     Disposition: Follow up in 6 months  Signed: Prentice Docker, M.D.,  F.A.C.C.  08/31/2016, 3:47 PM

## 2016-09-02 ENCOUNTER — Telehealth: Payer: Self-pay | Admitting: Cardiovascular Disease

## 2016-09-02 NOTE — Telephone Encounter (Signed)
Returned call to patient.  Stated that the Toradol is really not helping.  Informed her that Dr. Purvis SheffieldKoneswaran will not prescribe narcotic pain meds.  He did give the Toradol to use as needed to try and take the edge off of her pain.  Suggested she call surgeons office (has OV there on 09/07/16).  They may be willing to give enough to get through the weekend.  Patient verbalized understanding.

## 2016-09-02 NOTE — Telephone Encounter (Signed)
traMADol (ULTRAM) 50 MG tablet   Would like to know if she is suppose to be taking this medication.

## 2016-09-03 ENCOUNTER — Other Ambulatory Visit: Payer: Self-pay | Admitting: Thoracic Surgery (Cardiothoracic Vascular Surgery)

## 2016-09-03 DIAGNOSIS — Z951 Presence of aortocoronary bypass graft: Secondary | ICD-10-CM

## 2016-09-07 ENCOUNTER — Encounter: Payer: Self-pay | Admitting: Thoracic Surgery (Cardiothoracic Vascular Surgery)

## 2016-09-07 ENCOUNTER — Ambulatory Visit
Admission: RE | Admit: 2016-09-07 | Discharge: 2016-09-07 | Disposition: A | Discharge status: Home / Self Care | Payer: No Typology Code available for payment source | Source: Ambulatory Visit | Attending: Thoracic Surgery (Cardiothoracic Vascular Surgery) | Admitting: Thoracic Surgery (Cardiothoracic Vascular Surgery)

## 2016-09-07 ENCOUNTER — Ambulatory Visit (INDEPENDENT_AMBULATORY_CARE_PROVIDER_SITE_OTHER): Payer: Self-pay | Admitting: Thoracic Surgery (Cardiothoracic Vascular Surgery)

## 2016-09-07 VITALS — BP 110/64 | HR 72 | Resp 16 | Ht 69.0 in | Wt 199.6 lb

## 2016-09-07 DIAGNOSIS — Z951 Presence of aortocoronary bypass graft: Secondary | ICD-10-CM

## 2016-09-07 DIAGNOSIS — J90 Pleural effusion, not elsewhere classified: Secondary | ICD-10-CM

## 2016-09-07 MED ORDER — TRAMADOL HCL 50 MG PO TABS: 100.0000 mg | ORAL_TABLET | Freq: Four times a day (QID) | ORAL | 0 refills | Status: AC | PRN

## 2016-09-07 NOTE — Patient Instructions (Signed)
Continue to avoid any heavy lifting or strenuous use of your arms or shoulders for at least a total of three months from the time of surgery.  After three months you may gradually increase how much you lift or otherwise use your arms or chest as tolerated, with limits based upon whether or not activities lead to the return of significant discomfort.  Continue all previous medications without any changes at this time  You may return to driving an automobile as long as you are no longer requiring oral narcotic pain relievers during the daytime.  It would be wise to start driving only short distances during the daylight and gradually increase from there as you feel comfortable.  You are encouraged to enroll and participate in the outpatient cardiac rehab program beginning as soon as practical.  Stop smoking immediately and permanently.

## 2016-09-07 NOTE — Progress Notes (Signed)
[address]       [place] [address]             [phone]     CARDIOTHORACIC SURGERY OFFICE NOTE  Referring Provider is End, Cristal Deer, MD  Primary Cardiologist is Prentice Docker, MD PCP is Catherine Goodman, No Pcp Per   HPI:  Catherine Goodman is a 36 year old female with history of tobacco abuse, polysubstance abuse, and a strong family history of premature coronary artery disease who returns to the office today for routine follow-up status post emergency off-pump coronary artery bypass grafting 1 on 08/13/2016 for severe single-vessel coronary artery disease status post acute non-ST segment elevation myocardial infarction.  Her early postoperative recovery in the hospital was uneventful and she was discharged home on the fifth postoperative day. Since hospital discharge she was seen in the emergency department 08/29/2016 with complaints of chest pain consistent with postoperative musculoskeletal pain. CT angiogram of the chest was performed and notable for the absence of pulmonary embolus. The Catherine Goodman did have a small to moderate sized left pleural effusion. She has been seen in follow-up by Dr. Purvis Sheffield 08/31/2016 and she returns to our office today for routine follow-up. She states that she is still having problems with pain in her chest, particularly on the right side with radiation to the right shoulder. Pain is exacerbated by movement, deep breath, and coughing. Pain sometimes radiates to the right upper back. She has no shortness of breath. Appetite is fair. She is still smoking cigarettes.   Current Outpatient Prescriptions  Medication Sig Dispense Refill  . aspirin EC 81 MG EC tablet Take 1 tablet (81 mg total) by mouth daily.    Marland Kitchen atorvastatin (LIPITOR) 40 MG tablet Take 1 tablet (40 mg total) by mouth daily at 6 PM. 30 tablet 1  . busPIRone (BUSPAR) 10 MG tablet Take 10 mg by mouth daily.    . clopidogrel (PLAVIX) 75 MG tablet Take 1 tablet (75 mg total) by  mouth daily. 30 tablet 1  . diphenhydramine-acetaminophen (TYLENOL PM) 25-500 MG TABS tablet Take 2 tablets by mouth at bedtime as needed.    Marland Kitchen lisinopril (PRINIVIL,ZESTRIL) 10 MG tablet Take 1 tablet (10 mg total) by mouth daily. 30 tablet 1  . metoprolol tartrate (LOPRESSOR) 25 MG tablet Take 1 tablet (25 mg total) by mouth 2 (two) times daily. 60 tablet 1  . ondansetron (ZOFRAN) 4 MG tablet Take 1 tablet (4 mg total) by mouth every 8 (eight) hours as needed for nausea or vomiting. 14 tablet 0  . oxyCODONE (OXY IR/ROXICODONE) 5 MG immediate release tablet Take 5 mg by mouth every 4-6 hours PRN severe pain. (Catherine Goodman not taking: Reported on 09/07/2016) 40 tablet 0  . traMADol (ULTRAM) 50 MG tablet Take 1 tablet (50 mg total) by mouth every 6 (six) hours as needed. (Catherine Goodman not taking: Reported on 09/07/2016) 30 tablet 0   No current facility-administered medications for this visit.       Physical Exam:   BP 110/64 (BP Location: Right Arm, Catherine Goodman Position: Sitting, Cuff Size: Large)   Pulse 72   Resp 16   Ht 5\' 9"  (1.753 m)   Wt 199 lb 9.6 oz (90.5 kg)   LMP 08/17/2016 Comment: neg preg test  SpO2 98% Comment: ON RA  BMI 29.48 kg/m   General:  Well-appearing  Chest:   Clear to auscultation with slightly diminished breath sounds left lung base  CV:   Regular rate and rhythm  Incisions:  Clean and dry healing nicely, sternum is stable, palpation of sternum reproduces the Catherine Goodman's pain  Abdomen:  Soft nontender  Extremities:  Warm and well-perfused  Diagnostic Tests:  CHEST  2 VIEW  COMPARISON:  08/29/2016  FINDINGS: Previous median sternotomy and CABG procedure. There is mild cardiac enlargement. Moderate left pleural effusion is identified. No pneumothorax. No right-sided pleural effusion.  IMPRESSION: Persistent moderate left pleural effusion.   Electronically Signed   By: Signa Kell M.D.   On: 09/07/2016 09:30    Impression:  Catherine Goodman is doing reasonably  well approximately one month following emergency coronary artery bypass grafting 1 for critical left anterior descending coronary artery stenosis status post acute myocardial infarction. She still has considerable postoperative chest wall pain, but she has not been taking any sort of pain relievers other than Tylenol for the last few weeks. She has a small to moderate sized left pleural effusion with some left lower lobe atelectasis and likely slightly elevated left hemidiaphragm. She continues to smoke cigarettes.  Plan:  We have not recommended any changes to the Catherine Goodman's current medications. I have given the Catherine Goodman a prescription refill for tramadol to be utilized as needed for severe pain and to help her rest at night. I suggested that she continue take either Tylenol or ibuprofen. She has been reminded to refrain from any type of heavy lifting or strenuous use of her arms or shoulders for at least another 2 months. She has been strongly encouraged to find a way to quit smoking. I do not feel that thoracentesis is necessary at this time and the Catherine Goodman specifically does not wish to consider thoracentesis since she is not short of breath. The Catherine Goodman will return to our office in approximately 2 months. We will obtain repeat chest x-ray at that time to make sure that her pleural effusion is resolving. All of her questions been addressed.    Catherine Goodman. Catherine Moras, MD 09/07/2016 9:42 AM

## 2016-10-23 ENCOUNTER — Other Ambulatory Visit: Payer: Self-pay | Admitting: Physician Assistant

## 2016-11-06 ENCOUNTER — Other Ambulatory Visit: Payer: Self-pay | Admitting: Thoracic Surgery (Cardiothoracic Vascular Surgery)

## 2016-11-06 DIAGNOSIS — I25119 Atherosclerotic heart disease of native coronary artery with unspecified angina pectoris: Secondary | ICD-10-CM

## 2016-11-09 ENCOUNTER — Encounter: Payer: Self-pay | Admitting: Thoracic Surgery (Cardiothoracic Vascular Surgery)

## 2017-09-27 IMAGING — DX DG CHEST 2V
2 series · 2 of 2 positions shown · non-contrast
Comparison: 08/17/2016

CLINICAL DATA: Open heart surgery 2 weeks ago, drains removed
[REDACTED], rib pain since with inspiration and moving RIGHT arm,
intermittent chills, history coronary disease post NSTEMI and CABG,
smoker

EXAM:
CHEST  2 VIEW

[chest pa]
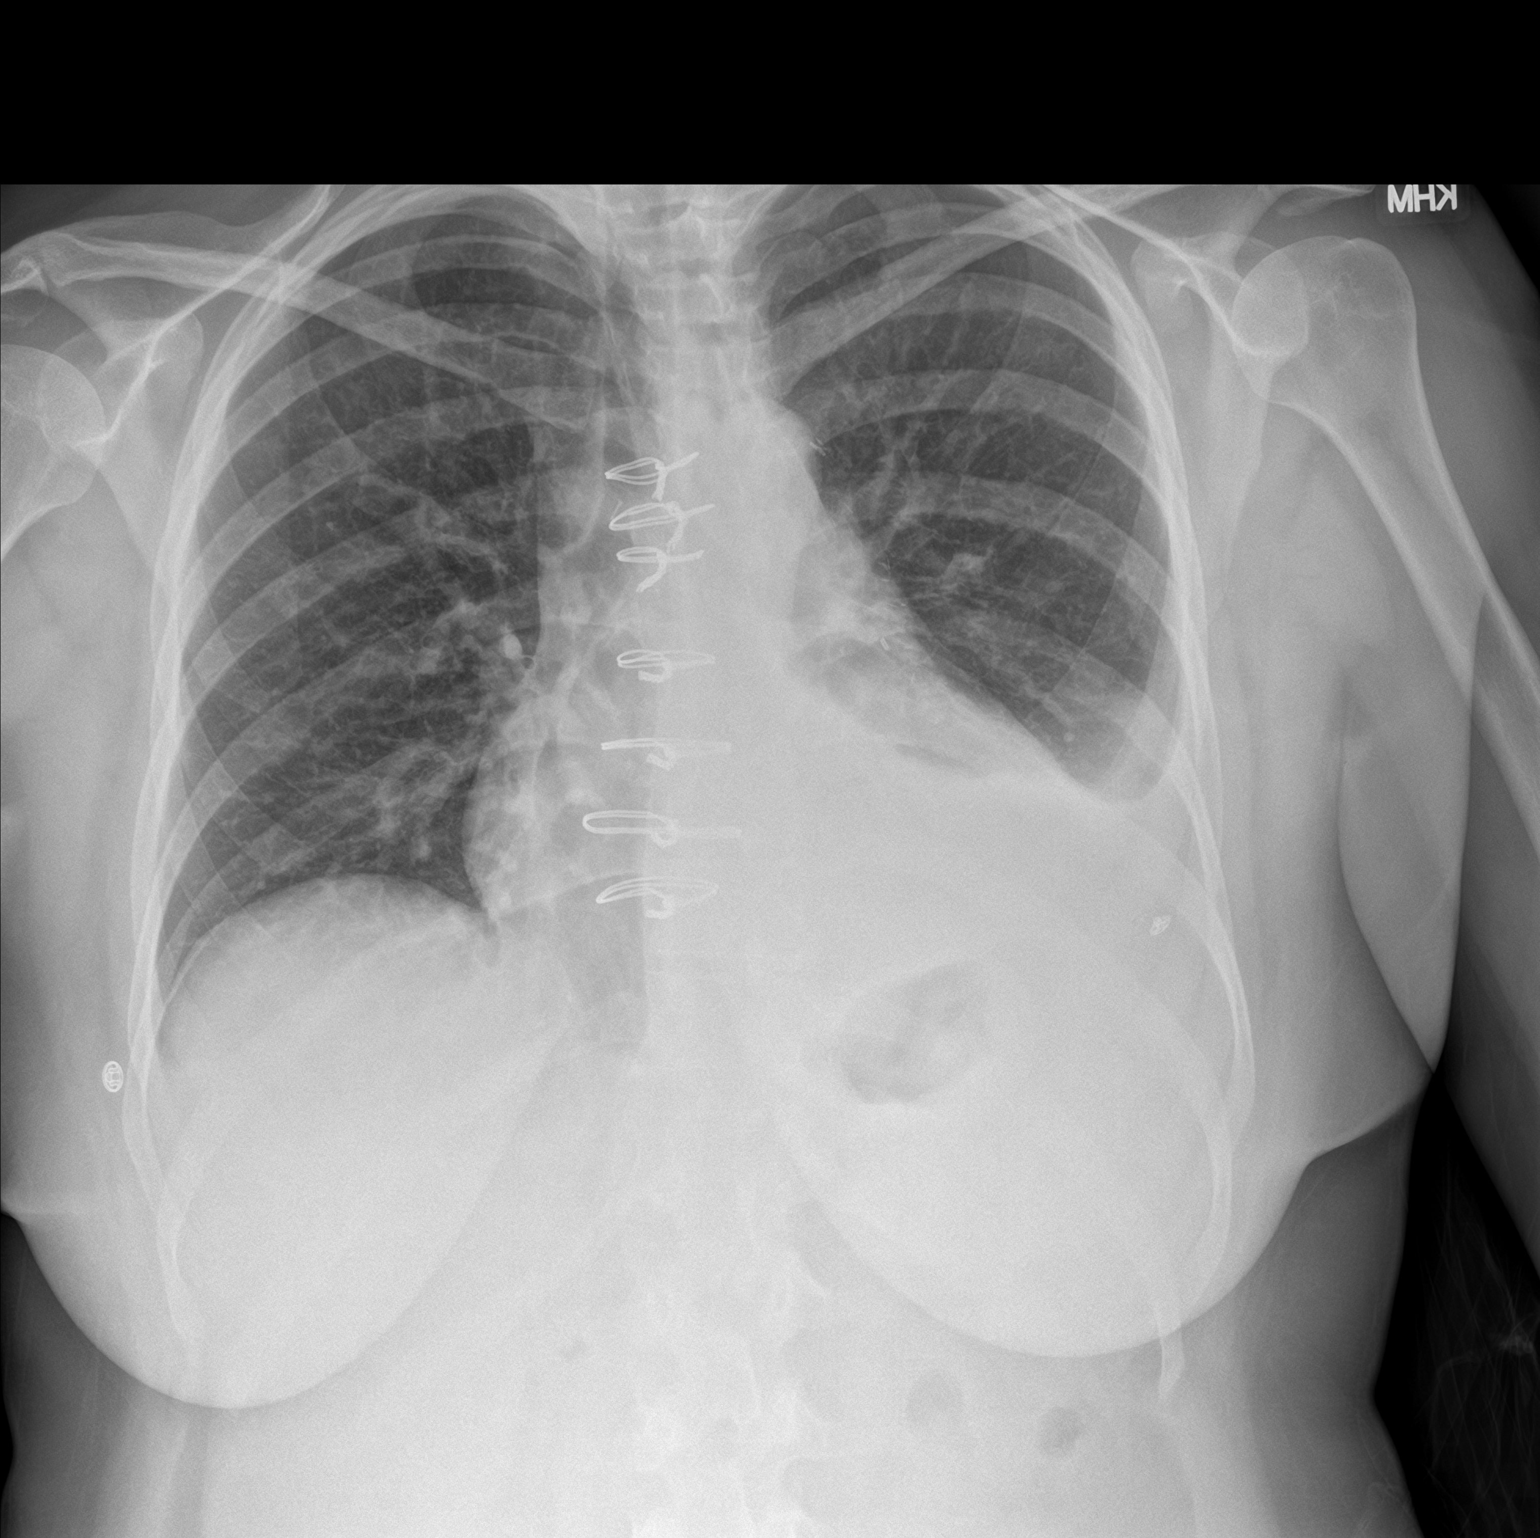

[chest lat]
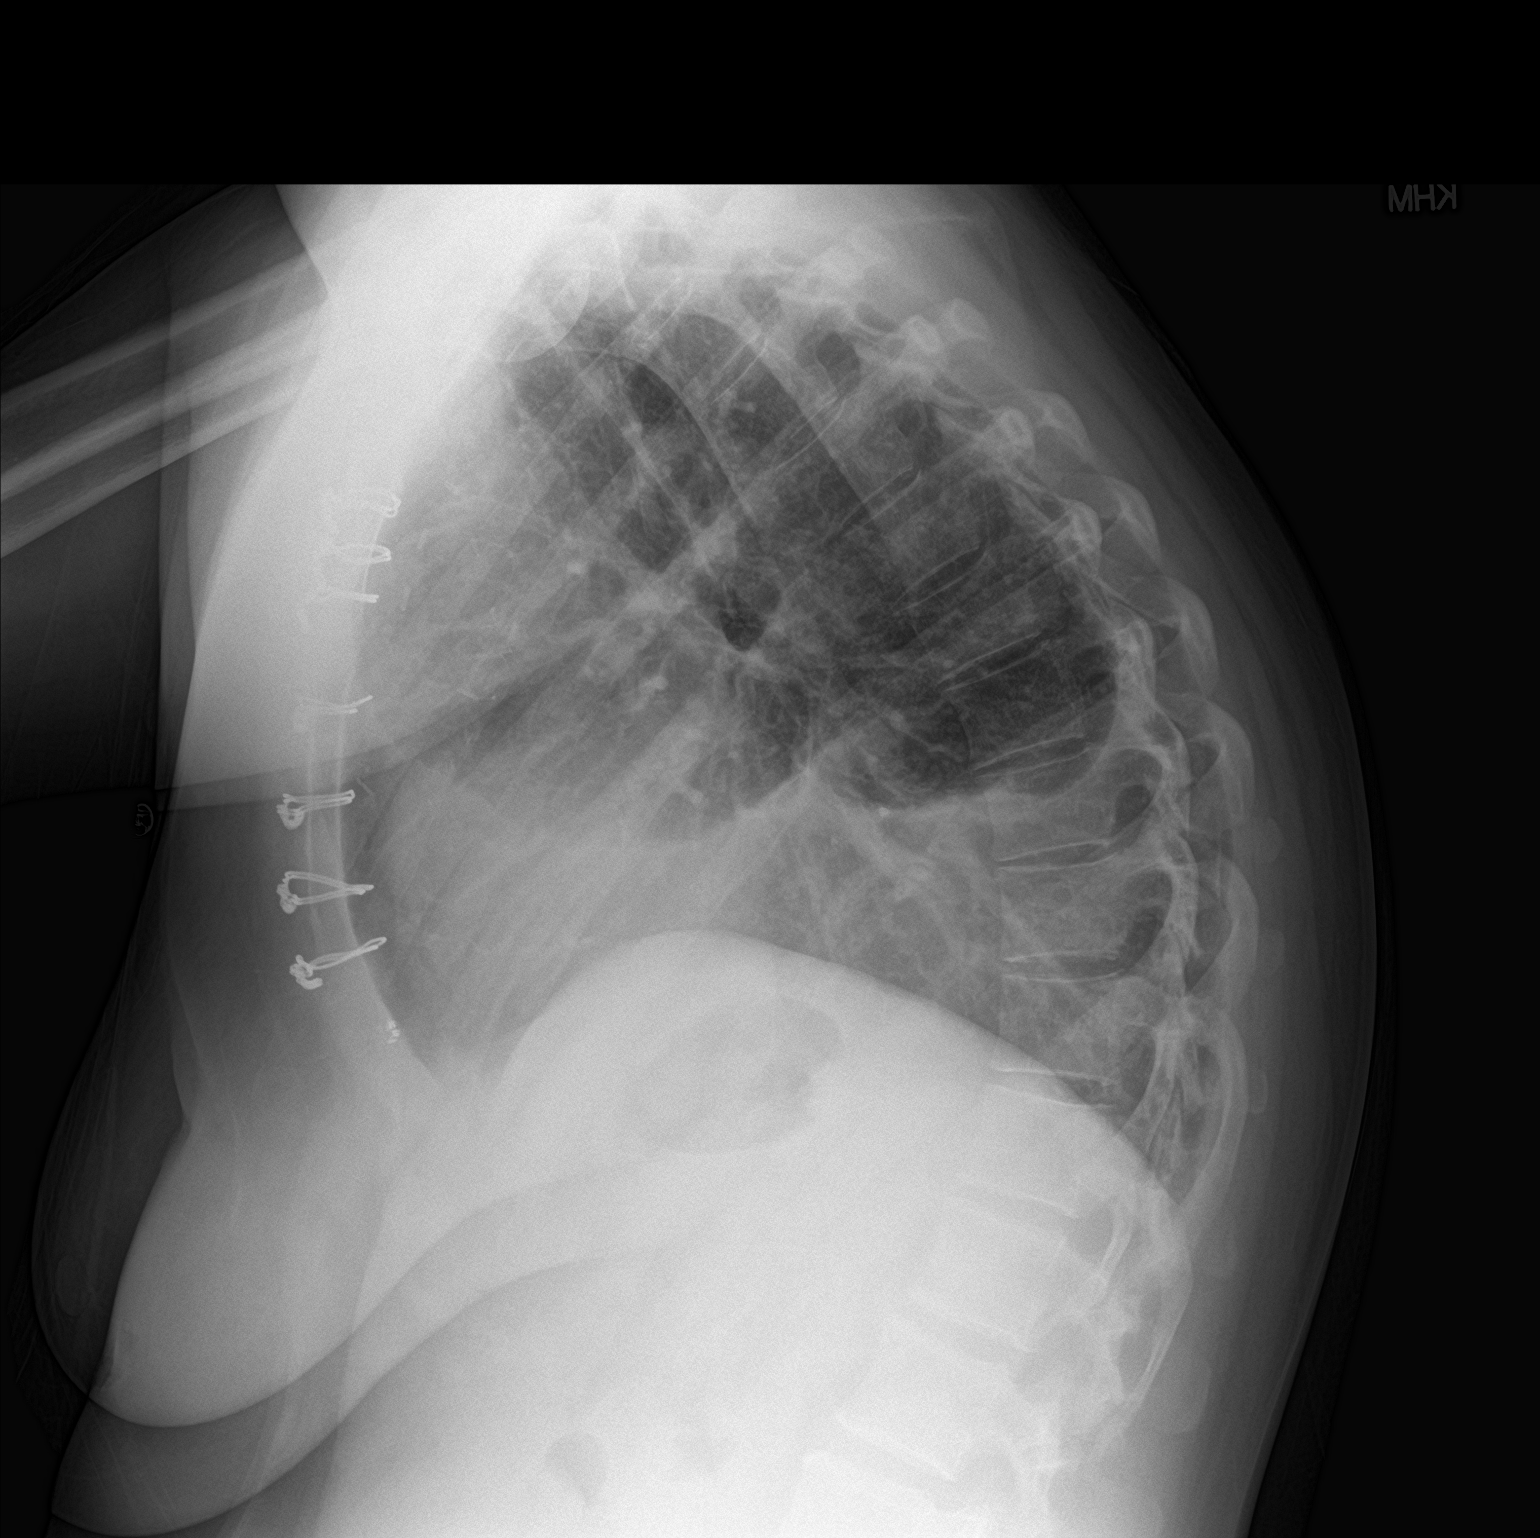

[2 of 2 positions shown; findings below may reference images not displayed]

FINDINGS: Enlargement of cardiac silhouette post median sternotomy.

Mediastinal contours and pulmonary vascularity normal.

Increased LEFT pleural effusion and basilar atelectasis since prior
study.

Remaining lungs clear.

No pneumothorax or acute osseous findings.
IMPRESSION: Increased LEFT pleural effusion and basilar atelectasis.

## 2017-10-06 IMAGING — DX DG CHEST 2V
2 series · 2 of 2 positions shown · non-contrast
Comparison: 08/29/2016

CLINICAL DATA: Status post CABG 08/13/2016

EXAM:
CHEST  2 VIEW

[dg chest 2 view (1 of 2)]
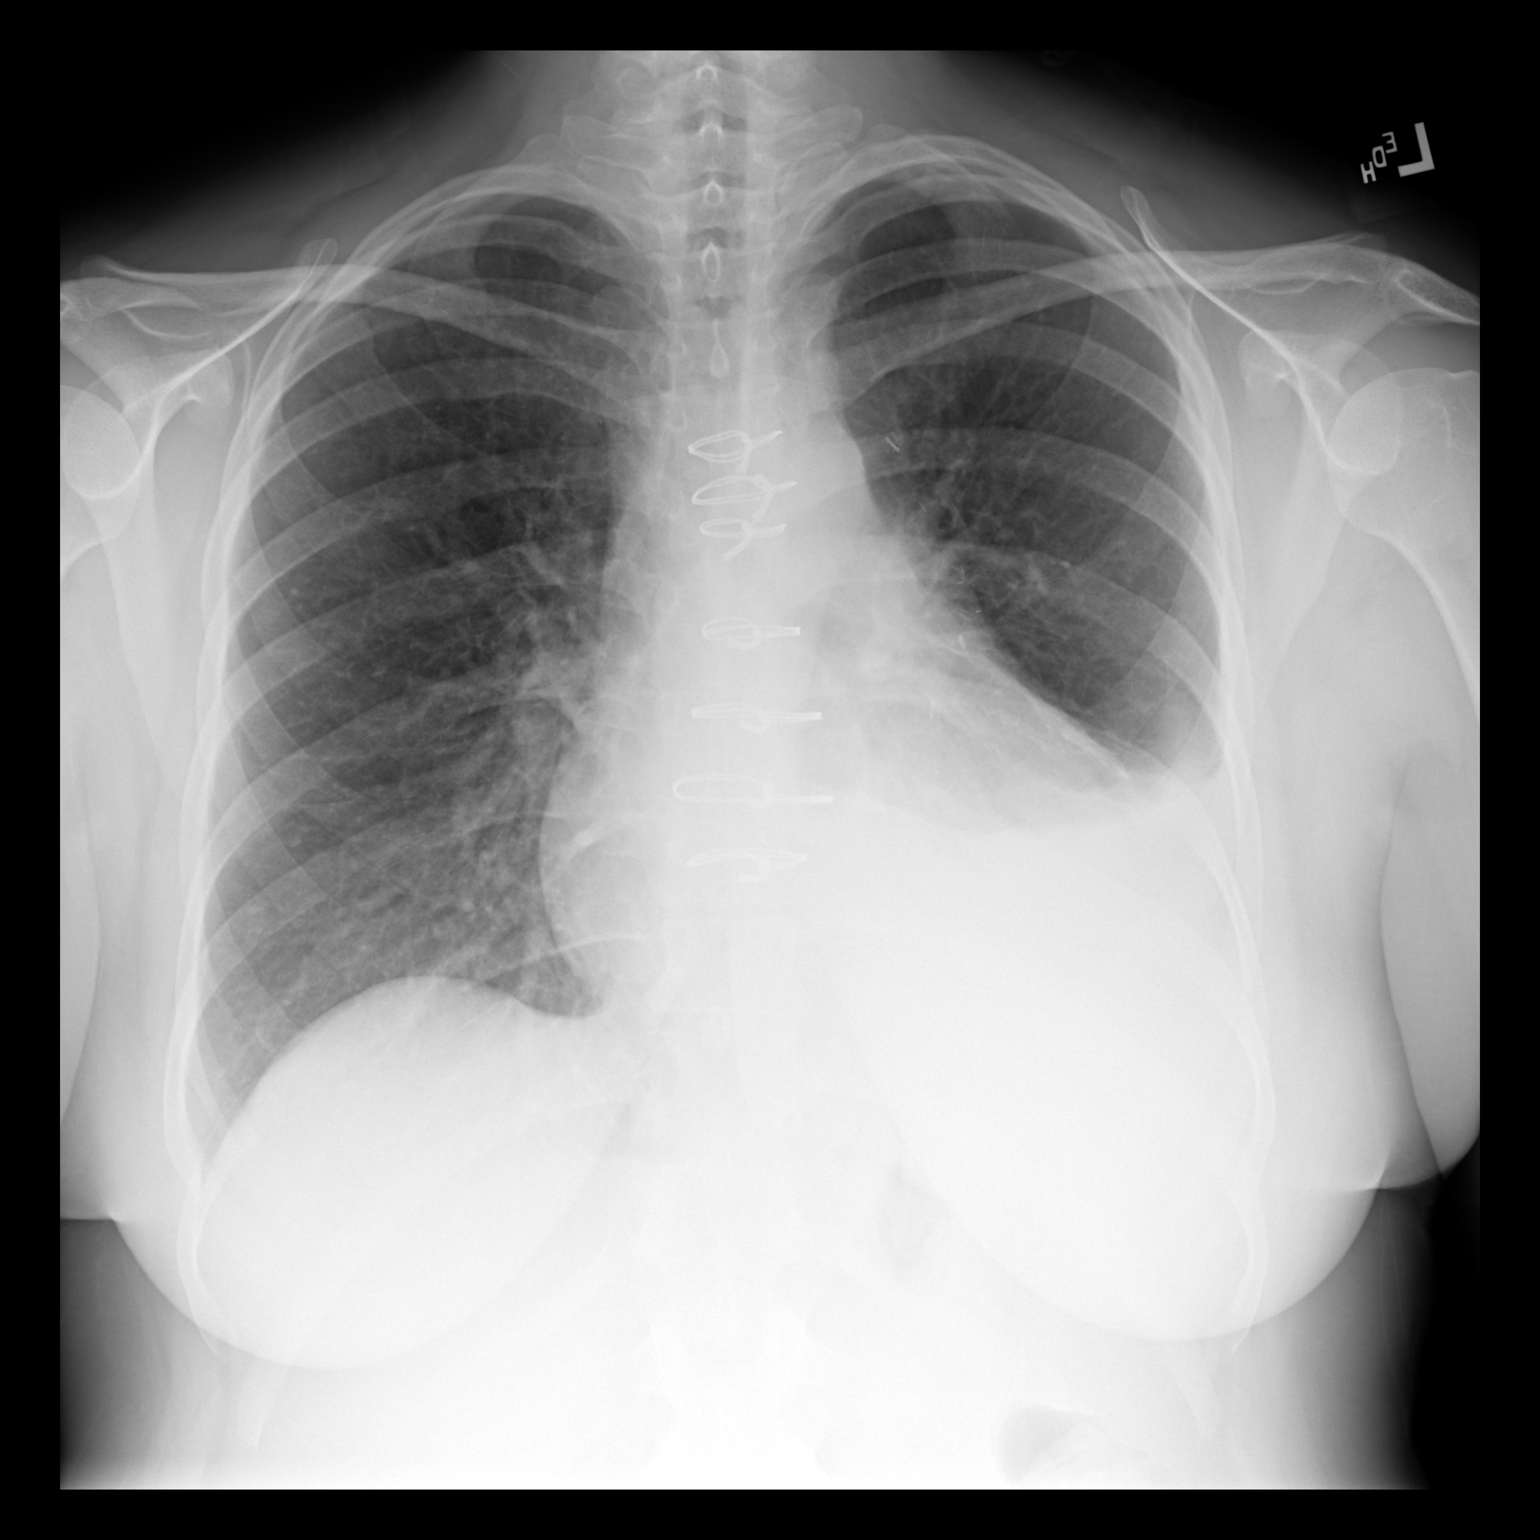

[dg chest 2 view (2 of 2)]
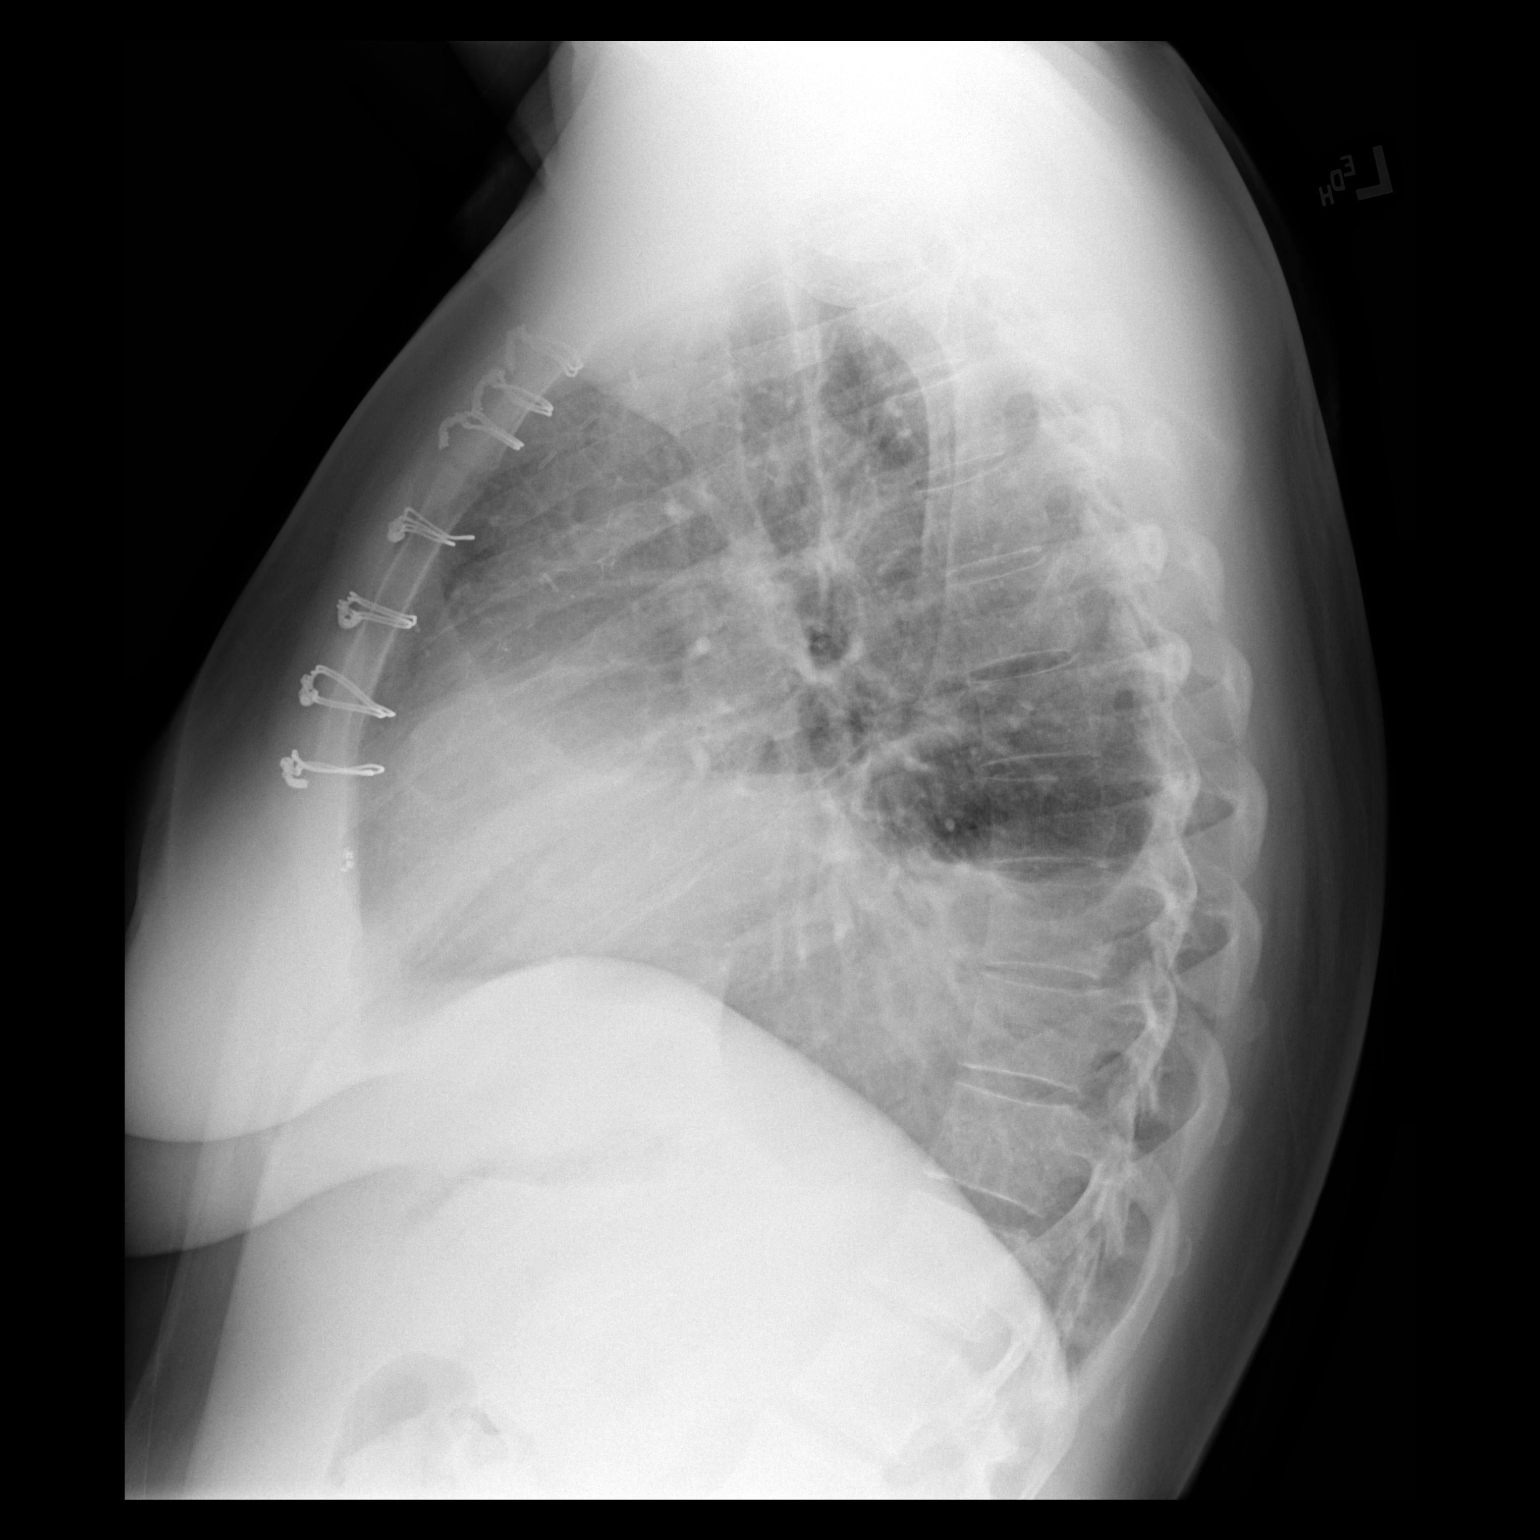

[2 of 2 positions shown; findings below may reference images not displayed]

FINDINGS: Previous median sternotomy and CABG procedure. There is mild cardiac
enlargement. Moderate left pleural effusion is identified. No
pneumothorax. No right-sided pleural effusion.
IMPRESSION: Persistent moderate left pleural effusion.

## 2018-02-17 ENCOUNTER — Telehealth: Payer: Self-pay | Admitting: Cardiovascular Disease

## 2018-02-17 NOTE — Telephone Encounter (Signed)
Numerous attempts to contact patient with recall letters. Unable to reach by telephone. with no success.    08/31/2016 4:19 PM New [10]    [System] 11/19/2016 11:04 PM Notification Sent [20]   Megan SalonSlaughter, Vicky T [[id number]][[id number]] 11/10/2017 1:14 PM Notification Sent [20]   Megan SalonSlaughter, Vicky T [[id number]][[id number]] 02/17/2018 11:09 AM Notification Sent [20]
